# Patient Record
Sex: Female | Born: 1960 | Race: White | Hispanic: No | Marital: Married | Smoking: Current every day smoker
Health system: Southern US, Community
[De-identification: ages and names within clinical notes are randomized; demographics above are authoritative.]

## PROBLEM LIST (undated history)

## (undated) DIAGNOSIS — F329 Major depressive disorder, single episode, unspecified: Secondary | ICD-10-CM

## (undated) DIAGNOSIS — F32A Depression, unspecified: Secondary | ICD-10-CM

## (undated) HISTORY — PX: AMPUTATION FINGER: SHX6594

## (undated) HISTORY — PX: TMJ ARTHROPLASTY: SHX1066

## (undated) HISTORY — DX: Major depressive disorder, single episode, unspecified: F32.9

## (undated) HISTORY — DX: Depression, unspecified: F32.A

---

## 2000-12-20 HISTORY — PX: AUGMENTATION MAMMAPLASTY: SUR837

## 2013-04-03 ENCOUNTER — Ambulatory Visit: Payer: Self-pay | Admitting: Unknown Physician Specialty

## 2013-04-05 LAB — PATHOLOGY REPORT

## 2014-05-10 DIAGNOSIS — Z720 Tobacco use: Secondary | ICD-10-CM | POA: Insufficient documentation

## 2014-05-10 DIAGNOSIS — R413 Other amnesia: Secondary | ICD-10-CM | POA: Insufficient documentation

## 2014-07-18 DIAGNOSIS — G479 Sleep disorder, unspecified: Secondary | ICD-10-CM | POA: Insufficient documentation

## 2016-10-19 DIAGNOSIS — F32A Depression, unspecified: Secondary | ICD-10-CM | POA: Insufficient documentation

## 2016-10-19 DIAGNOSIS — F329 Major depressive disorder, single episode, unspecified: Secondary | ICD-10-CM | POA: Insufficient documentation

## 2017-10-14 ENCOUNTER — Encounter: Payer: Self-pay | Admitting: Obstetrics and Gynecology

## 2017-10-18 ENCOUNTER — Encounter: Payer: Self-pay | Admitting: Obstetrics and Gynecology

## 2017-10-18 ENCOUNTER — Ambulatory Visit (INDEPENDENT_AMBULATORY_CARE_PROVIDER_SITE_OTHER): Payer: 59 | Admitting: Obstetrics and Gynecology

## 2017-10-18 VITALS — BP 122/80 | HR 67 | Ht 63.0 in | Wt 122.0 lb

## 2017-10-18 DIAGNOSIS — N9419 Other specified dyspareunia: Secondary | ICD-10-CM | POA: Diagnosis not present

## 2017-10-18 DIAGNOSIS — Z1239 Encounter for other screening for malignant neoplasm of breast: Secondary | ICD-10-CM

## 2017-10-18 DIAGNOSIS — Z1231 Encounter for screening mammogram for malignant neoplasm of breast: Secondary | ICD-10-CM

## 2017-10-18 DIAGNOSIS — Z124 Encounter for screening for malignant neoplasm of cervix: Secondary | ICD-10-CM

## 2017-10-18 DIAGNOSIS — N952 Postmenopausal atrophic vaginitis: Secondary | ICD-10-CM | POA: Diagnosis not present

## 2017-10-18 DIAGNOSIS — Z01419 Encounter for gynecological examination (general) (routine) without abnormal findings: Secondary | ICD-10-CM

## 2017-10-18 MED ORDER — ESTROGENS, CONJUGATED 0.625 MG/GM VA CREA: 1.0000 | TOPICAL_CREAM | VAGINAL | 3 refills | Status: DC

## 2017-10-18 NOTE — Patient Instructions (Signed)
PCP [facility]: Dr. Dimas Aguas or Dr. Dion [facility]: Dr. Kirkland Hun [facility]  Preventive Care 40-64 Years, Female Preventive care refers to lifestyle choices and visits with your health care provider that can promote health and wellness. What does preventive care include?  A yearly physical exam. This is also called an annual well check.  Dental exams once or twice a year.  Routine eye exams. Ask your health care provider how often you should have your eyes checked.  Personal lifestyle choices, including: ? Daily care of your teeth and gums. ? Regular physical activity. ? Eating a healthy diet. ? Avoiding tobacco and drug use. ? Limiting alcohol use. ? Practicing safe sex. ? Taking low-dose aspirin daily starting at age 3. ? Taking vitamin and mineral supplements as recommended by your health care provider. What happens during an annual well check? The services and screenings done by your health care provider during your annual well check will depend on your age, overall health, lifestyle risk factors, and family history of disease. Counseling Your health care provider may ask you questions about your:  Alcohol use.  Tobacco use.  Drug use.  Emotional well-being.  Home and relationship well-being.  Sexual activity.  Eating habits.  Work and work Statistician.  Method of birth control.  Menstrual cycle.  Pregnancy history.  Screening You may have the following tests or measurements:  Height, weight, and BMI.  Blood pressure.  Lipid and cholesterol levels. These may be checked every 5 years, or more frequently if you are over 34 years old.  Skin check.  Lung cancer screening. You may have this screening every year starting at age 55 if you have a 30-pack-year history of smoking and currently smoke or have quit within the past 15 years.  Fecal occult blood test (FOBT) of the stool. You may have this test every year  starting at age 20.  Flexible sigmoidoscopy or colonoscopy. You may have a sigmoidoscopy every 5 years or a colonoscopy every 10 years starting at age 51.  Hepatitis C blood test.  Hepatitis B blood test.  Sexually transmitted disease (STD) testing.  Diabetes screening. This is done by checking your blood sugar (glucose) after you have not eaten for a while (fasting). You may have this done every 1-3 years.  Mammogram. This may be done every 1-2 years. Talk to your health care provider about when you should start having regular mammograms. This may depend on whether you have a family history of breast cancer.  BRCA-related cancer screening. This may be done if you have a family history of breast, ovarian, tubal, or peritoneal cancers.  Pelvic exam and Pap test. This may be done every 3 years starting at age 41. Starting at age 23, this may be done every 5 years if you have a Pap test in combination with an HPV test.  Bone density scan. This is done to screen for osteoporosis. You may have this scan if you are at high risk for osteoporosis.  Discuss your test results, treatment options, and if necessary, the need for more tests with your health care provider. Vaccines Your health care provider may recommend certain vaccines, such as:  Influenza vaccine. This is recommended every year.  Tetanus, diphtheria, and acellular pertussis (Tdap, Td) vaccine. You may need a Td booster every 10 years.  Varicella vaccine. You may need this if you have not been vaccinated.  Zoster vaccine. You may need this after age 3.  Measles, mumps, and  rubella (MMR) vaccine. You may need at least one dose of MMR if you were born in 1957 or later. You may also need a second dose.  Pneumococcal 13-valent conjugate (PCV13) vaccine. You may need this if you have certain conditions and were not previously vaccinated.  Pneumococcal polysaccharide (PPSV23) vaccine. You may need one or two doses if you smoke  cigarettes or if you have certain conditions.  Meningococcal vaccine. You may need this if you have certain conditions.  Hepatitis A vaccine. You may need this if you have certain conditions or if you travel or work in places where you may be exposed to hepatitis A.  Hepatitis B vaccine. You may need this if you have certain conditions or if you travel or work in places where you may be exposed to hepatitis B.  Haemophilus influenzae type b (Hib) vaccine. You may need this if you have certain conditions.  Talk to your health care provider about which screenings and vaccines you need and how often you need them. This information is not intended to replace advice given to you by your health care provider. Make sure you discuss any questions you have with your health care provider. Document Released: 01/02/2016 Document Revised: 08/25/2016 Document Reviewed: 10/07/2015 Elsevier Interactive Patient Education  2017 [employer].

## 2017-10-18 NOTE — Progress Notes (Signed)
Gynecology Annual Exam  PCP: Patient, No Pcp Per  Chief Complaint:  Chief Complaint  Patient presents with  . Gynecologic Exam    History of Present Illness:Patient is a 56 y.o. G0P0000 presents for annual exam. The patient has no complaints today.   LMP: No LMP recorded. Patient is postmenopausal.  The patient is sexually active. She has dyspareunia.  The patient does perform self breast exams.  There is no notable family history of breast or ovarian cancer in her family.  The patient wears seatbelts: yes.   The patient has regular exercise: not asked.    The patient denies current symptoms of depression.     Review of Systems: Review of Systems  Constitutional: Negative for chills and fever.  HENT: Negative for congestion.   Respiratory: Negative for cough and shortness of breath.   Cardiovascular: Negative for chest pain and palpitations.  Gastrointestinal: Negative for abdominal pain, constipation, diarrhea, heartburn, nausea and vomiting.  Genitourinary: Negative for dysuria, frequency and urgency.  Skin: Negative for itching and rash.  Neurological: Negative for dizziness and headaches.  Endo/Heme/Allergies: Negative for polydipsia.  Psychiatric/Behavioral: Negative for depression.    Past Medical History:  Past Medical History:  Diagnosis Date  . Depression     Past Surgical History:  Past Surgical History:  Procedure Laterality Date  . AMPUTATION FINGER Right    index finger  . TMJ ARTHROPLASTY      Gynecologic History:  No LMP recorded. Patient is postmenopausal. Last Pap: Results were: 07/10/2017 NIL and HR HPV negative   Family History:  Family History  Problem Relation Age of Onset  . Colon cancer Father 91  . Melanoma Sister 65    Social History:  Social History   Social History  . Marital status: Married    Spouse name: N/A  . Number of children: N/A  . Years of education: N/A   Occupational History  . Not on file.   Social  History Main Topics  . Smoking status: Never Smoker  . Smokeless tobacco: Never Used  . Alcohol use No  . Drug use: No  . Sexual activity: Yes    Birth control/ protection: Post-menopausal   Other Topics Concern  . Not on file   Social History Narrative  . No narrative on file    Allergies:  No Known Allergies  Medications: Prior to Admission medications   Medication Sig Start Date End Date Taking? Authorizing Provider  escitalopram (LEXAPRO) 20 MG tablet Take by mouth.   Yes [provider]    Physical Exam Vitals: Blood pressure 122/80, pulse 67, height 5\' 3"  (1.6 m), weight 122 lb (55.3 kg).  General: NAD HEENT: normocephalic, anicteric Thyroid: no enlargement, no palpable nodules Pulmonary: No increased work of breathing, CTAB Cardiovascular: RRR, distal pulses 2+ Breast: Breast symmetrical, no tenderness, no palpable nodules or masses, no skin or nipple retraction present, no nipple discharge.  No axillary or supraclavicular lymphadenopathy. Abdomen: NABS, soft, non-tender, non-distended.  Umbilicus without lesions.  No hepatomegaly, splenomegaly or masses palpable. No evidence of hernia  Genitourinary:  External: Normal external female genitalia.  Normal urethral meatus, normal  Bartholin's and Skene's glands.    Vagina: Normal vaginal mucosa, no evidence of prolapse.    Cervix: Grossly normal in appearance, no bleeding  Uterus: Non-enlarged, mobile, normal contour.  No CMT  Adnexa: ovaries non-enlarged, no adnexal masses  Rectal: deferred  Lymphatic: no evidence of inguinal lymphadenopathy Extremities: no edema, erythema, or tenderness Neurologic:  Grossly intact Psychiatric: mood appropriate, affect full  Female chaperone present for pelvic and breast  portions of the physical exam     Assessment: 56 y.o. G0P0000 routine annual exam  Plan: Problem List Items Addressed This Visit    None    Visit Diagnoses    Dyspareunia due to medical condition  in female    -  Primary   Screening for malignant neoplasm of cervix       Relevant Orders   PapIG, HPV, rfx 16/18   Breast screening       Relevant Orders   MM DIGITAL SCREENING BILATERAL   Encounter for gynecological examination without abnormal finding       Vaginal atrophy          1) Mammogram - recommend yearly screening mammogram.  Mammogram Was ordered today  2) STI screening was not offered  3) ASCCP guidelines and rational discussed.  Patient opts for yearly screening interval  4) Osteoporosis  - per USPTF routine screening DEXA at age 59 - normal baseline DEXA 2013  5) Routine healthcare maintenance including cholesterol, diabetes screening discussed managed by PCP  6) Colonoscopy - 04/03/2013 Tubular adenoma recommended follow up is 2019  7) Follow up 1 year for routine annual

## 2017-10-20 ENCOUNTER — Encounter: Payer: Self-pay | Admitting: Obstetrics and Gynecology

## 2017-10-20 LAB — PAPIG, HPV, RFX 16/18
HPV, HIGH-RISK: NEGATIVE
PAP Smear Comment: 0

## 2018-01-25 ENCOUNTER — Other Ambulatory Visit: Payer: Self-pay | Admitting: Obstetrics and Gynecology

## 2018-01-25 ENCOUNTER — Ambulatory Visit
Admission: RE | Admit: 2018-01-25 | Discharge: 2018-01-25 | Disposition: A | Discharge status: Home / Self Care | Payer: 59 | Source: Ambulatory Visit | Attending: Obstetrics and Gynecology | Admitting: Obstetrics and Gynecology

## 2018-01-25 DIAGNOSIS — Z1239 Encounter for other screening for malignant neoplasm of breast: Secondary | ICD-10-CM

## 2018-01-25 DIAGNOSIS — Z1231 Encounter for screening mammogram for malignant neoplasm of breast: Secondary | ICD-10-CM | POA: Diagnosis present

## 2018-02-01 ENCOUNTER — Other Ambulatory Visit: Payer: Self-pay | Admitting: *Deleted

## 2018-02-01 ENCOUNTER — Inpatient Hospital Stay
Admission: RE | Admit: 2018-02-01 | Discharge: 2018-02-01 | Disposition: A | Discharge status: Home / Self Care | Payer: Self-pay | Source: Ambulatory Visit | Attending: *Deleted | Admitting: *Deleted

## 2018-02-01 DIAGNOSIS — Z9289 Personal history of other medical treatment: Secondary | ICD-10-CM

## 2018-02-02 ENCOUNTER — Encounter: Payer: Self-pay | Admitting: Obstetrics and Gynecology

## 2021-01-16 ENCOUNTER — Other Ambulatory Visit: Payer: Self-pay

## 2021-01-16 ENCOUNTER — Encounter: Payer: Self-pay | Admitting: Obstetrics and Gynecology

## 2021-01-16 ENCOUNTER — Ambulatory Visit (INDEPENDENT_AMBULATORY_CARE_PROVIDER_SITE_OTHER): Payer: No Typology Code available for payment source | Admitting: Obstetrics and Gynecology

## 2021-01-16 ENCOUNTER — Other Ambulatory Visit (HOSPITAL_COMMUNITY)
Admission: RE | Admit: 2021-01-16 | Discharge: 2021-01-16 | Disposition: A | Discharge status: Home / Self Care | Payer: No Typology Code available for payment source | Source: Ambulatory Visit | Attending: Obstetrics and Gynecology | Admitting: Obstetrics and Gynecology

## 2021-01-16 VITALS — BP 120/70 | Ht 63.0 in | Wt 126.3 lb

## 2021-01-16 DIAGNOSIS — Z124 Encounter for screening for malignant neoplasm of cervix: Secondary | ICD-10-CM | POA: Insufficient documentation

## 2021-01-16 DIAGNOSIS — Z1239 Encounter for other screening for malignant neoplasm of breast: Secondary | ICD-10-CM

## 2021-01-16 DIAGNOSIS — Z01419 Encounter for gynecological examination (general) (routine) without abnormal findings: Secondary | ICD-10-CM | POA: Diagnosis not present

## 2021-01-16 DIAGNOSIS — Z1211 Encounter for screening for malignant neoplasm of colon: Secondary | ICD-10-CM

## 2021-01-16 MED ORDER — PREMARIN 0.625 MG/GM VA CREA: 1.0000 | TOPICAL_CREAM | VAGINAL | 3 refills | Status: DC

## 2021-01-16 NOTE — Patient Instructions (Signed)
[facility] [address] [place] [address]  [facility]  [address] [place] [address]  Phone: [phone]  

## 2021-01-16 NOTE — Progress Notes (Signed)
Annual Exam

## 2021-01-16 NOTE — Progress Notes (Signed)
Gynecology Annual Exam  PCP: Patient, No Pcp Per  Chief Complaint: No chief complaint on file.   History of Present Illness:Patient is a 60 y.o. G0P0000 presents for annual exam. The patient has no complaints today.   LMP: No LMP recorded. Patient is postmenopausal. No PMB concerns.  The patient is sexually active. She denies dyspareunia.  The patient does perform self breast exams.  There is no notable family history of breast or ovarian cancer in her family.  The patient wears seatbelts: yes.   The patient has regular exercise: not asked.    The patient denies current symptoms of depression, stable on current dose cymbalta.     Review of Systems: Review of Systems  Constitutional: Negative for chills and fever.  HENT: Negative for congestion.   Respiratory: Negative for cough and shortness of breath.   Cardiovascular: Negative for chest pain and palpitations.  Gastrointestinal: Negative for abdominal pain, constipation, diarrhea, heartburn, nausea and vomiting.  Genitourinary: Negative for dysuria, frequency and urgency.  Skin: Negative for itching and rash.  Neurological: Negative for dizziness and headaches.  Endo/Heme/Allergies: Negative for polydipsia.  Psychiatric/Behavioral: Negative for depression.    Past Medical History:  There are no problems to display for this patient.   Past Surgical History:  Past Surgical History:  Procedure Laterality Date  . AMPUTATION FINGER Right    index finger  . AUGMENTATION MAMMAPLASTY Bilateral 4315   silicone  . TMJ ARTHROPLASTY      Gynecologic History:  No LMP recorded. Patient is postmenopausal. Last Pap: Results were: 10/01/2017 NIL and HR HPV negative  Last mammogram: 01/25/2018 Results were: BI-RAD I  Obstetric History: G0P0000  Family History:  Family History  Problem Relation Age of Onset  . Colon cancer Father 54  . Melanoma Sister 21    Social History:  Social History   Socioeconomic History  .  Marital status: Married    Spouse name: Not on file  . Number of children: Not on file  . Years of education: Not on file  . Highest education level: Not on file  Occupational History  . Not on file  Tobacco Use  . Smoking status: Never Smoker  . Smokeless tobacco: Never Used  Vaping Use  . Vaping Use: Every day  Substance and Sexual Activity  . Alcohol use: No  . Drug use: No  . Sexual activity: Yes    Birth control/protection: Post-menopausal  Other Topics Concern  . Not on file  Social History Narrative  . Not on file   Social Determinants of Health   Financial Resource Strain: Not on file  Food Insecurity: Not on file  Transportation Needs: Not on file  Physical Activity: Not on file  Stress: Not on file  Social Connections: Not on file  Intimate Partner Violence: Not on file    Allergies:  No Known Allergies  Medications: Prior to Admission medications   Medication Sig Start Date End Date Taking? Authorizing Provider  DULoxetine (CYMBALTA) 30 MG capsule Take 30 mg by mouth daily. 11/26/20  Yes [provider]  conjugated estrogens (PREMARIN) vaginal cream Place 1 Applicatorful vaginally 2 (two) times a week. 10/20/17   Malachy Mood, MD  escitalopram (LEXAPRO) 20 MG tablet Take by mouth. Patient not taking: Reported on 01/16/2021    [provider]    Physical Exam Vitals: Blood pressure 120/70, height 5\' 3"  (1.6 m), weight 126 lb 4.8 oz (57.3 kg).  General: NAD HEENT: normocephalic, anicteric Thyroid:  no enlargement, no palpable nodules Pulmonary: No increased work of breathing, CTAB Cardiovascular: RRR, distal pulses 2+ Breast: Breast symmetrical, no tenderness, no palpable nodules or masses, no skin or nipple retraction present, no nipple discharge.  No axillary or supraclavicular lymphadenopathy. Bilateral above muscle implants Abdomen: NABS, soft, non-tender, non-distended.  Umbilicus without lesions.  No hepatomegaly, splenomegaly or  masses palpable. No evidence of hernia  Genitourinary:  External: Normal external female genitalia.  Normal urethral meatus, normal Bartholin's and Skene's glands.    Vagina: Normal vaginal mucosa, no evidence of prolapse.    Cervix: Grossly normal in appearance, no bleeding  Uterus: Non-enlarged, mobile, normal contour.  No CMT  Adnexa: ovaries non-enlarged, no adnexal masses  Rectal: deferred  Lymphatic: no evidence of inguinal lymphadenopathy Extremities: no edema, erythema, or tenderness Neurologic: Grossly intact Psychiatric: mood appropriate, affect full  Female chaperone present for pelvic and breast  portions of the physical exam   There is no immunization history on file for this patient.    Assessment: 60 y.o. G0P0000 routine annual exam  Plan: Problem List Items Addressed This Visit   None   Visit Diagnoses    Encounter for gynecological examination without abnormal finding    -  Primary   Breast screening       Relevant Orders   MS DIGITAL SCREENING TOMO W/IMPLANTS BILAT   Screening for malignant neoplasm of cervix       Relevant Orders   Cytology - PAP   Colon cancer screening       Relevant Orders   Ambulatory referral to Gastroenterology      1) Mammogram - recommend yearly screening mammogram.  Mammogram Was ordered today  2) STI screening  was notoffered and therefore not obtained  3) ASCCP guidelines and rational discussed.  Patient opts for every 3 years screening interval  4) Osteoporosis  - per USPTF routine screening DEXA at age 38  5) Routine healthcare maintenance including cholesterol, diabetes screening discussed managed by PCP  6) Colonoscopy 04/03/2013 Tubular adenoma recommended follow up is 2019.  Has not had done yet so ordered  7) Return in about 1 year (around 01/16/2022) for annual.    Malachy Mood, MD Alyssa Sosa, [place] 01/16/2021, 3:04 PM

## 2021-01-21 LAB — CYTOLOGY - PAP
Comment: NEGATIVE
Diagnosis: NEGATIVE
High risk HPV: NEGATIVE

## 2021-02-12 ENCOUNTER — Encounter: Payer: Self-pay | Admitting: *Deleted

## 2021-02-19 ENCOUNTER — Other Ambulatory Visit: Payer: Self-pay | Admitting: Obstetrics and Gynecology

## 2021-02-19 DIAGNOSIS — Z1239 Encounter for other screening for malignant neoplasm of breast: Secondary | ICD-10-CM

## 2021-02-24 ENCOUNTER — Telehealth: Payer: Self-pay

## 2021-02-24 ENCOUNTER — Telehealth (INDEPENDENT_AMBULATORY_CARE_PROVIDER_SITE_OTHER): Payer: Self-pay | Admitting: Gastroenterology

## 2021-02-24 ENCOUNTER — Other Ambulatory Visit: Payer: Self-pay

## 2021-02-24 DIAGNOSIS — Z86010 Personal history of colonic polyps: Secondary | ICD-10-CM

## 2021-02-24 DIAGNOSIS — K635 Polyp of colon: Secondary | ICD-10-CM | POA: Insufficient documentation

## 2021-02-24 MED ORDER — PEG 3350-KCL-NA BICARB-NACL 420 G PO SOLR: 4000.0000 mL | Freq: Once | ORAL | 0 refills | Status: AC

## 2021-02-24 NOTE — Telephone Encounter (Signed)
Patients call has been returned in regard to moving her colonoscopy date from 03/15 to 03/16.  LVM to let her know that her colonoscopy date has been changed to 03/04/21.  Physician is now Dr. Marius Ditch instead of Dr. Bonna Gains. Pt advised of COVID test date Monday 03/02/21.  New instructions will be sent via mychart and mailed.  Thanks,  Alyssa Sosa, [place]

## 2021-02-24 NOTE — Progress Notes (Signed)
Gastroenterology Pre-Procedure Review  Request Date: Tuesday 03/03/21 Requesting Physician: Dr. Allen Norris  PATIENT REVIEW QUESTIONS: The patient responded to the following health history questions as indicated:    1. Are you having any GI issues? no 2. Do you have a personal history of Polyps? yes (pt states 6-7 years ago she had polyps on last colonoscopy ) 3. Do you have a family history of Colon Cancer or Polyps? yes (father colon cancer) 4. Diabetes Mellitus? no 5. Joint replacements in the past 12 months?no 6. Major health problems in the past 3 months?no 7. Any artificial heart valves, MVP, or defibrillator?no    MEDICATIONS & ALLERGIES:    Patient reports the following regarding taking any anticoagulation/antiplatelet therapy:   Plavix, Coumadin, Eliquis, Xarelto, Lovenox, Pradaxa, Brilinta, or Effient? no Aspirin? no  Patient confirms/reports the following medications:  Current Outpatient Medications  Medication Sig Dispense Refill  . DULoxetine (CYMBALTA) 30 MG capsule Take 30 mg by mouth daily.    . polyethylene glycol-electrolytes (NULYTELY) 420 g solution Take 4,000 mLs by mouth once for 1 dose. Fill Nulytely container to the fill line with clear liquid.  Mix well.  Drink 8 oz every 30 minutes until entire contents have been completed. 4000 mL 0  . conjugated estrogens (PREMARIN) vaginal cream Place 1 Applicatorful vaginally 2 (two) times a week. (Patient not taking: Reported on 02/24/2021) 30 g 3  . ergocalciferol (VITAMIN D2) 1.25 MG (50000 UT) capsule ergocalciferol (vitamin D2) 1,250 mcg (50,000 unit) capsule     No current facility-administered medications for this visit.    Patient confirms/reports the following allergies:  No Known Allergies  Orders Placed This Encounter  Procedures  . Procedural/ Surgical Case Request: COLONOSCOPY WITH PROPOFOL    Standing Status:   Standing    Number of Occurrences:   1    Order Specific Question:   Pre-op diagnosis    Answer:    personal history of colon polyps    Order Specific Question:   CPT Code    Answer:   82641    AUTHORIZATION INFORMATION Primary Insurance: 1D#: Group #:  Secondary Insurance: 1D#: Group #:  SCHEDULE INFORMATION: Date: 03/03/21 Time: Location:[facility]

## 2021-03-02 ENCOUNTER — Other Ambulatory Visit: Payer: Self-pay

## 2021-03-02 ENCOUNTER — Other Ambulatory Visit
Admission: RE | Admit: 2021-03-02 | Discharge: 2021-03-02 | Disposition: A | Discharge status: Home / Self Care | Payer: No Typology Code available for payment source | Source: Ambulatory Visit | Attending: Gastroenterology | Admitting: Gastroenterology

## 2021-03-02 DIAGNOSIS — Z20822 Contact with and (suspected) exposure to covid-19: Secondary | ICD-10-CM | POA: Insufficient documentation

## 2021-03-02 DIAGNOSIS — Z01812 Encounter for preprocedural laboratory examination: Secondary | ICD-10-CM | POA: Insufficient documentation

## 2021-03-02 LAB — SARS CORONAVIRUS 2 (TAT 6-24 HRS): SARS Coronavirus 2: NEGATIVE

## 2021-03-04 ENCOUNTER — Ambulatory Visit: Payer: No Typology Code available for payment source | Admitting: Anesthesiology

## 2021-03-04 ENCOUNTER — Encounter
Admission: RE | Disposition: A | Discharge status: Home / Self Care | Payer: Self-pay | Source: Home / Self Care | Attending: Gastroenterology

## 2021-03-04 ENCOUNTER — Ambulatory Visit
Admission: RE | Admit: 2021-03-04 | Discharge: 2021-03-04 | Disposition: A | Discharge status: Home / Self Care | Payer: No Typology Code available for payment source | Attending: Gastroenterology | Admitting: Gastroenterology

## 2021-03-04 ENCOUNTER — Other Ambulatory Visit: Payer: Self-pay

## 2021-03-04 ENCOUNTER — Encounter: Payer: Self-pay | Admitting: Gastroenterology

## 2021-03-04 DIAGNOSIS — Z86010 Personal history of colonic polyps: Secondary | ICD-10-CM

## 2021-03-04 DIAGNOSIS — Z800 Family history of malignant neoplasm of digestive organs: Secondary | ICD-10-CM | POA: Diagnosis not present

## 2021-03-04 DIAGNOSIS — K644 Residual hemorrhoidal skin tags: Secondary | ICD-10-CM | POA: Diagnosis not present

## 2021-03-04 DIAGNOSIS — D122 Benign neoplasm of ascending colon: Secondary | ICD-10-CM | POA: Insufficient documentation

## 2021-03-04 DIAGNOSIS — Z89021 Acquired absence of right finger(s): Secondary | ICD-10-CM | POA: Diagnosis not present

## 2021-03-04 DIAGNOSIS — K635 Polyp of colon: Secondary | ICD-10-CM

## 2021-03-04 DIAGNOSIS — Z79899 Other long term (current) drug therapy: Secondary | ICD-10-CM | POA: Insufficient documentation

## 2021-03-04 DIAGNOSIS — Z1211 Encounter for screening for malignant neoplasm of colon: Secondary | ICD-10-CM | POA: Insufficient documentation

## 2021-03-04 HISTORY — PX: COLONOSCOPY WITH PROPOFOL: SHX5780

## 2021-03-04 SURGERY — COLONOSCOPY WITH PROPOFOL
Anesthesia: General

## 2021-03-04 MED ORDER — PROPOFOL 500 MG/50ML IV EMUL
INTRAVENOUS | Status: DC | PRN
  Administered 2021-03-04: 176 ug/kg/min via INTRAVENOUS

## 2021-03-04 MED ORDER — PROPOFOL 500 MG/50ML IV EMUL
INTRAVENOUS | Status: AC
  Filled 2021-03-04: qty 50

## 2021-03-04 MED ORDER — LIDOCAINE HCL (PF) 2 % IJ SOLN
INTRAMUSCULAR | Status: AC
  Filled 2021-03-04: qty 5

## 2021-03-04 MED ORDER — SODIUM CHLORIDE 0.9 % IV SOLN: INTRAVENOUS | Status: DC

## 2021-03-04 MED ORDER — PROPOFOL 10 MG/ML IV BOLUS
INTRAVENOUS | Status: DC | PRN
  Administered 2021-03-04: 80 mg via INTRAVENOUS

## 2021-03-04 MED ORDER — LIDOCAINE HCL (CARDIAC) PF 100 MG/5ML IV SOSY
PREFILLED_SYRINGE | INTRAVENOUS | Status: DC | PRN
  Administered 2021-03-04: 50 mg via INTRAVENOUS

## 2021-03-04 NOTE — Transfer of Care (Signed)
Immediate Anesthesia Transfer of Care Note  Patient: Alyssa Sosa  Procedure(s) Performed: COLONOSCOPY WITH PROPOFOL (N/A )  Patient Location: PACU  Anesthesia Type:General  Level of Consciousness: awake and drowsy  Airway & Oxygen Therapy: Patient Spontanous Breathing  Post-op Assessment: Report given to RN and Post -op Vital signs reviewed and stable  Post vital signs: Reviewed and stable  Last Vitals:  Vitals Value Taken Time  BP 97/70 03/04/21 1052  Temp 36.5 C 03/04/21 1050  Pulse 71 03/04/21 1052  Resp 16 03/04/21 1052  SpO2 99 % 03/04/21 1052    Last Pain:  Vitals:   03/04/21 1050  TempSrc: Temporal  PainSc: 0-No pain         Complications: No complications documented.

## 2021-03-04 NOTE — Op Note (Signed)
[facility] Gastroenterology Patient Name: Natha Guin Procedure Date: 03/04/2021 10:12 AM MRN: [id number] Account #: [id number] Date of Birth: 07/21/1961 Admit Type: Outpatient Age: 60 Room: [facility] ENDO ROOM 4 Gender: Female Note Status: Finalized Procedure:             Colonoscopy Indications:           Screening in patient at increased risk: Colorectal                         cancer in father before age 22, Surveillance: Personal                         history of adenomatous polyps on last colonoscopy 5                         years ago, Last colonoscopy: April 2014 Providers:             Lin Landsman MD, MD Medicines:             General Anesthesia Complications:         No immediate complications. Estimated blood loss: None. Procedure:             Pre-Anesthesia Assessment:                        - Prior to the procedure, a History and Physical was                         performed, and patient medications and allergies were                         reviewed. The patient is competent. The risks and                         benefits of the procedure and the sedation options and                         risks were discussed with the patient. All questions                         were answered and informed consent was obtained.                         Patient identification and proposed procedure were                         verified by the physician, the nurse, the                         anesthesiologist, the anesthetist and the technician                         in the pre-procedure area in the procedure room in the                         endoscopy suite. Mental Status Examination: alert and  oriented. Airway Examination: normal oropharyngeal                         airway and neck mobility. Respiratory Examination:                         clear to auscultation. CV Examination: normal.                         Prophylactic  Antibiotics: The patient does not require                         prophylactic antibiotics. Prior Anticoagulants: The                         patient has taken no previous anticoagulant or                         antiplatelet agents. ASA Grade Assessment: II - A                         patient with mild systemic disease. After reviewing                         the risks and benefits, the patient was deemed in                         satisfactory condition to undergo the procedure. The                         anesthesia plan was to use general anesthesia.                         Immediately prior to administration of medications,                         the patient was re-assessed for adequacy to receive                         sedatives. The heart rate, respiratory rate, oxygen                         saturations, blood pressure, adequacy of pulmonary                         ventilation, and response to care were monitored                         throughout the procedure. The physical status of the                         patient was re-assessed after the procedure.                        After obtaining informed consent, the colonoscope was                         passed under direct vision. Throughout the procedure,  the patient's blood pressure, pulse, and oxygen                         saturations were monitored continuously. The was                         introduced through the anus and advanced to the the                         cecum, identified by appendiceal orifice and ileocecal                         valve. The colonoscopy was performed without                         difficulty. The patient tolerated the procedure well.                         The quality of the bowel preparation was evaluated                         using the BBPS [facility] Bowel Preparation Scale) with                         scores of: Right Colon = 3, Transverse Colon = 3 and                          Left Colon = 3 (entire mucosa seen well with no                         residual staining, small fragments of stool or opaque                         liquid). The total BBPS score equals 9. Findings:      Skin tags were found on perianal exam.      Two sessile polyps were found in the ascending colon. The polyps were 4       to 5 mm in size. These polyps were removed with a cold snare. Resection       and retrieval were complete.      The retroflexed view of the distal rectum and anal verge was normal and       showed no anal or rectal abnormalities.      The exam was otherwise without abnormality. Impression:            - Perianal skin tags found on perianal exam.                        - Two 4 to 5 mm polyps in the ascending colon, removed                         with a cold snare. Resected and retrieved.                        - The distal rectum and anal verge are normal on  retroflexion view.                        - The examination was otherwise normal. Recommendation:        - Discharge patient to home (with escort).                        - Resume previous diet today.                        - Continue present medications.                        - Await pathology results.                        - Repeat colonoscopy in 5 years for surveillance. Procedure Code(s):     --- Professional ---                        [id number], Colonoscopy, flexible; with removal of                         tumor(s), polyp(s), or other lesion(s) by snare                         technique Diagnosis Code(s):     --- Professional ---                        Z80.0, Family history of malignant neoplasm of                         digestive organs                        Z86.010, Personal history of colonic polyps                        K63.5, Polyp of colon                        K64.4, Residual hemorrhoidal skin tags CPT copyright 2019 American Medical Association. All rights  reserved. The codes documented in this report are preliminary and upon coder review may  be revised to meet current compliance requirements. Dr. Ulyess Mort Lin Landsman MD, MD 03/04/2021 10:48:13 AM This report has been signed electronically. Number of Addenda: 0 Note Initiated On: 03/04/2021 10:12 AM Scope Withdrawal Time: 0 hours 16 minutes 7 seconds  Total Procedure Duration: 0 hours 22 minutes 3 seconds  Estimated Blood Loss:  Estimated blood loss: none.      [facility]

## 2021-03-04 NOTE — Anesthesia Preprocedure Evaluation (Signed)
Anesthesia Evaluation  Patient identified by MRN, date of birth, ID band Patient awake    Reviewed: Allergy & Precautions, H&P , NPO status , Patient's Chart, lab work & pertinent test results, reviewed documented beta blocker date and time   Airway Mallampati: II   Neck ROM: full    Dental  (+) Poor Dentition   Pulmonary neg pulmonary ROS, Patient abstained from smoking.,    Pulmonary exam normal        Cardiovascular Exercise Tolerance: Good negative cardio ROS Normal cardiovascular exam Rhythm:regular Rate:Normal     Neuro/Psych PSYCHIATRIC DISORDERS Depression negative neurological ROS     GI/Hepatic negative GI ROS, Neg liver ROS,   Endo/Other  negative endocrine ROS  Renal/GU negative Renal ROS  negative genitourinary   Musculoskeletal   Abdominal   Peds  Hematology negative hematology ROS (+)   Anesthesia Other Findings Past Medical History: No date: Depression Past Surgical History: No date: AMPUTATION FINGER; Right     Comment:  index finger 2002: AUGMENTATION MAMMAPLASTY; Bilateral     Comment:  silicone 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 BMI    Body Mass Index: 19.84 kg/m     Reproductive/Obstetrics negative OB ROS                             Anesthesia Physical Anesthesia Plan  ASA: II  Anesthesia Plan: General   Post-op Pain Management:    Induction:   PONV Risk Score and Plan:   Airway Management Planned:   Additional Equipment:   Intra-op Plan:   Post-operative Plan:   Informed Consent: I have reviewed the patients History and Physical, chart, labs and discussed the procedure including the risks, benefits and alternatives for the proposed anesthesia with the patient or authorized representative who has indicated his/her understanding and acceptance.     Dental Advisory Given  Plan Discussed with: CRNA  Anesthesia Plan Comments:         Anesthesia  Quick Evaluation

## 2021-03-04 NOTE — Anesthesia Procedure Notes (Signed)
Date/Time: 03/04/2021 10:20 AM Performed by: Johnna Acosta, CRNA Pre-anesthesia Checklist: Patient identified, Emergency Drugs available, Suction available, Patient being monitored and Timeout performed Patient Re-evaluated:Patient Re-evaluated prior to induction Oxygen Delivery Method: Nasal cannula Preoxygenation: Pre-oxygenation with 100% oxygen Induction Type: IV induction

## 2021-03-04 NOTE — H&P (Signed)
  Cephas Darby, MD [address]  [place]  [place], [place] [address]  Main: [phone]  Fax: [phone] Pager: [phone]  Primary Care Physician:  Newell Coral, PA-C Primary Gastroenterologist:  Dr. Cephas Darby  Pre-Procedure History & Physical: HPI:  Alyssa Sosa is a 60 y.o. female is here for an colonoscopy.   Past Medical History:  Diagnosis Date  . Depression     Past Surgical History:  Procedure Laterality Date  . AMPUTATION FINGER Right    index finger  . AUGMENTATION MAMMAPLASTY Bilateral [id number]   silicone  . TMJ ARTHROPLASTY      Prior to Admission medications   Medication Sig Start Date End Date Taking? Authorizing Provider  conjugated estrogens (PREMARIN) vaginal cream Place 1 Applicatorful vaginally 2 (two) times a week. Patient not taking: Reported on 02/24/2021 01/19/21   Malachy Mood, MD  DULoxetine (CYMBALTA) 30 MG capsule Take 30 mg by mouth daily. 11/26/20   [provider]  ergocalciferol (VITAMIN D2) 1.25 MG (50000 UT) capsule ergocalciferol (vitamin D2) 1,250 mcg (50,000 unit) capsule    [provider]    Allergies as of 02/24/2021  . (No Known Allergies)    Family History  Problem Relation Age of Onset  . Colon cancer Father 94  . Melanoma Sister 40    Social History   Socioeconomic History  . Marital status: Married    Spouse name: Not on file  . Number of children: Not on file  . Years of education: Not on file  . Highest education level: Not on file  Occupational History  . Not on file  Tobacco Use  . Smoking status: Never Smoker  . Smokeless tobacco: Never Used  Vaping Use  . Vaping Use: Some days  Substance and Sexual Activity  . Alcohol use: No  . Drug use: No  . Sexual activity: Yes    Birth control/protection: Post-menopausal  Other Topics Concern  . Not on file  Social History Narrative  . Not on file   Social Determinants of Health   Financial Resource Strain: Not on file   Food Insecurity: Not on file  Transportation Needs: Not on file  Physical Activity: Not on file  Stress: Not on file  Social Connections: Not on file  Intimate Partner Violence: Not on file    Review of Systems: See HPI, otherwise negative ROS  Physical Exam: BP 133/85   Pulse 66   Temp (!) 97.2 F (36.2 C) (Temporal)   Resp 16   Ht 5\' 3"  (1.6 m)   Wt 50.8 kg   SpO2 100%   BMI 19.84 kg/m  General:   Alert,  pleasant and cooperative in NAD Head:  Normocephalic and atraumatic. Neck:  Supple; no masses or thyromegaly. Lungs:  Clear throughout to auscultation.    Heart:  Regular rate and rhythm. Abdomen:  Soft, nontender and nondistended. Normal bowel sounds, without guarding, and without rebound.   Neurologic:  Alert and  oriented x4;  grossly normal neurologically.  Impression/Plan: Zalaya A Hakim is here for an colonoscopy to be performed for personal history of colon polyps  Risks, benefits, limitations, and alternatives regarding  colonoscopy have been reviewed with the patient.  Questions have been answered.  All parties agreeable.   Sherri Sear, MD  03/04/2021, 9:46 AM

## 2021-03-04 NOTE — Anesthesia Postprocedure Evaluation (Signed)
Anesthesia Post Note  Patient: Alyssa Sosa  Procedure(s) Performed: COLONOSCOPY WITH PROPOFOL (N/A )  Patient location during evaluation: PACU Anesthesia Type: General Level of consciousness: awake and alert Pain management: pain level controlled Vital Signs Assessment: post-procedure vital signs reviewed and stable Respiratory status: spontaneous breathing, nonlabored ventilation, respiratory function stable and patient connected to nasal cannula oxygen Cardiovascular status: blood pressure returned to baseline and stable Postop Assessment: no apparent nausea or vomiting Anesthetic complications: no   No complications documented.   Last Vitals:  Vitals:   03/04/21 1110 03/04/21 1120  BP: 137/90 139/83  Pulse: (!) 59 (!) 53  Resp: 18 20  Temp:    SpO2: 99% 99%    Last Pain:  Vitals:   03/04/21 1120  TempSrc:   PainSc: 0-No pain                 Molli Barrows

## 2021-03-05 ENCOUNTER — Encounter: Payer: Self-pay | Admitting: Gastroenterology

## 2021-03-05 LAB — SURGICAL PATHOLOGY

## 2021-03-10 ENCOUNTER — Ambulatory Visit
Admission: RE | Admit: 2021-03-10 | Discharge: 2021-03-10 | Disposition: A | Discharge status: Home / Self Care | Payer: No Typology Code available for payment source | Source: Ambulatory Visit | Attending: Obstetrics and Gynecology | Admitting: Obstetrics and Gynecology

## 2021-03-10 ENCOUNTER — Other Ambulatory Visit: Payer: Self-pay

## 2021-03-10 DIAGNOSIS — Z1231 Encounter for screening mammogram for malignant neoplasm of breast: Secondary | ICD-10-CM | POA: Insufficient documentation

## 2021-03-10 DIAGNOSIS — Z1239 Encounter for other screening for malignant neoplasm of breast: Secondary | ICD-10-CM

## 2022-01-18 ENCOUNTER — Inpatient Hospital Stay: Payer: BC Managed Care – PPO

## 2022-01-18 ENCOUNTER — Inpatient Hospital Stay
Admission: EM | Admit: 2022-01-18 | Discharge: 2022-01-21 | DRG: 390 | Disposition: A | Discharge status: Home / Self Care | Payer: BC Managed Care – PPO | Attending: Internal Medicine | Admitting: Internal Medicine

## 2022-01-18 ENCOUNTER — Other Ambulatory Visit: Payer: Self-pay

## 2022-01-18 ENCOUNTER — Emergency Department: Payer: BC Managed Care – PPO

## 2022-01-18 ENCOUNTER — Encounter: Payer: Self-pay | Admitting: Emergency Medicine

## 2022-01-18 DIAGNOSIS — F32A Depression, unspecified: Secondary | ICD-10-CM

## 2022-01-18 DIAGNOSIS — Z888 Allergy status to other drugs, medicaments and biological substances status: Secondary | ICD-10-CM | POA: Diagnosis not present

## 2022-01-18 DIAGNOSIS — D1803 Hemangioma of intra-abdominal structures: Secondary | ICD-10-CM | POA: Diagnosis present

## 2022-01-18 DIAGNOSIS — Z20822 Contact with and (suspected) exposure to covid-19: Secondary | ICD-10-CM | POA: Diagnosis present

## 2022-01-18 DIAGNOSIS — K56600 Partial intestinal obstruction, unspecified as to cause: Principal | ICD-10-CM | POA: Diagnosis present

## 2022-01-18 DIAGNOSIS — E860 Dehydration: Secondary | ICD-10-CM | POA: Diagnosis present

## 2022-01-18 DIAGNOSIS — F17210 Nicotine dependence, cigarettes, uncomplicated: Secondary | ICD-10-CM | POA: Diagnosis present

## 2022-01-18 DIAGNOSIS — Z79899 Other long term (current) drug therapy: Secondary | ICD-10-CM | POA: Diagnosis not present

## 2022-01-18 DIAGNOSIS — Z720 Tobacco use: Secondary | ICD-10-CM | POA: Diagnosis present

## 2022-01-18 DIAGNOSIS — K56609 Unspecified intestinal obstruction, unspecified as to partial versus complete obstruction: Secondary | ICD-10-CM

## 2022-01-18 DIAGNOSIS — Z79818 Long term (current) use of other agents affecting estrogen receptors and estrogen levels: Secondary | ICD-10-CM

## 2022-01-18 LAB — RESP PANEL BY RT-PCR (FLU A&B, COVID) ARPGX2
Influenza A by PCR: NEGATIVE
Influenza B by PCR: NEGATIVE
SARS Coronavirus 2 by RT PCR: NEGATIVE

## 2022-01-18 LAB — CBC
HCT: 46.1 % — ABNORMAL HIGH (ref 36.0–46.0)
Hemoglobin: 15 g/dL (ref 12.0–15.0)
MCH: 31.1 pg (ref 26.0–34.0)
MCHC: 32.5 g/dL (ref 30.0–36.0)
MCV: 95.6 fL (ref 80.0–100.0)
Platelets: 331 10*3/uL (ref 150–400)
RBC: 4.82 MIL/uL (ref 3.87–5.11)
RDW: 13 % (ref 11.5–15.5)
WBC: 10 10*3/uL (ref 4.0–10.5)
nRBC: 0 % (ref 0.0–0.2)

## 2022-01-18 LAB — LIPASE, BLOOD: Lipase: 27 U/L (ref 11–51)

## 2022-01-18 LAB — URINALYSIS, ROUTINE W REFLEX MICROSCOPIC
Bacteria, UA: NONE SEEN
Bilirubin Urine: NEGATIVE
Glucose, UA: NEGATIVE mg/dL
Nitrite: NEGATIVE
Protein, ur: NEGATIVE mg/dL
Specific Gravity, Urine: 1.02 (ref 1.005–1.030)
pH: 8.5 — ABNORMAL HIGH (ref 5.0–8.0)

## 2022-01-18 LAB — COMPREHENSIVE METABOLIC PANEL
ALT: 15 U/L (ref 0–44)
AST: 19 U/L (ref 15–41)
Albumin: 4.1 g/dL (ref 3.5–5.0)
Alkaline Phosphatase: 79 U/L (ref 38–126)
Anion gap: 10 (ref 5–15)
BUN: 23 mg/dL — ABNORMAL HIGH (ref 6–20)
CO2: 26 mmol/L (ref 22–32)
Calcium: 10.4 mg/dL — ABNORMAL HIGH (ref 8.9–10.3)
Chloride: 103 mmol/L (ref 98–111)
Creatinine, Ser: 0.67 mg/dL (ref 0.44–1.00)
GFR, Estimated: >60 mL/min (ref >60)
Glucose, Bld: 138 mg/dL — ABNORMAL HIGH (ref 70–99)
Potassium: 4.2 mmol/L (ref 3.5–5.1)
Sodium: 139 mmol/L (ref 135–145)
Total Bilirubin: 0.4 mg/dL (ref 0.3–1.2)
Total Protein: 7.2 g/dL (ref 6.5–8.1)

## 2022-01-18 LAB — TROPONIN I (HIGH SENSITIVITY)
Troponin I (High Sensitivity): <2 ng/L (ref <18)
Troponin I (High Sensitivity): 2 ng/L (ref <18)

## 2022-01-18 LAB — PROTIME-INR
INR: 0.9 (ref 0.8–1.2)
Prothrombin Time: 12.3 seconds (ref 11.4–15.2)

## 2022-01-18 LAB — POC URINE PREG, ED: Preg Test, Ur: NEGATIVE

## 2022-01-18 LAB — APTT: aPTT: 26 seconds (ref 24–36)

## 2022-01-18 LAB — HIV ANTIBODY (ROUTINE TESTING W REFLEX): HIV Screen 4th Generation wRfx: NONREACTIVE

## 2022-01-18 MED ORDER — PANTOPRAZOLE SODIUM 40 MG IV SOLR
40.0000 mg | Freq: Once | INTRAVENOUS | Status: AC
  Administered 2022-01-18: 40 mg via INTRAVENOUS
  Filled 2022-01-18: qty 40

## 2022-01-18 MED ORDER — FENTANYL CITRATE PF 50 MCG/ML IJ SOSY
50.0000 ug | PREFILLED_SYRINGE | INTRAMUSCULAR | Status: AC | PRN
  Administered 2022-01-18 (×2): 50 ug via INTRAVENOUS
  Filled 2022-01-18 (×2): qty 1

## 2022-01-18 MED ORDER — METOCLOPRAMIDE HCL 5 MG/ML IJ SOLN: 10.0000 mg | Freq: Three times a day (TID) | INTRAMUSCULAR | Status: DC | PRN

## 2022-01-18 MED ORDER — MORPHINE SULFATE (PF) 2 MG/ML IV SOLN
2.0000 mg | INTRAVENOUS | Status: DC | PRN
  Administered 2022-01-18: 2 mg via INTRAVENOUS
  Filled 2022-01-18: qty 1

## 2022-01-18 MED ORDER — ONDANSETRON 4 MG PO TBDP: 4.0000 mg | ORAL_TABLET | Freq: Once | ORAL | Status: DC | PRN

## 2022-01-18 MED ORDER — DULOXETINE HCL 30 MG PO CPEP
30.0000 mg | ORAL_CAPSULE | Freq: Every day | ORAL | Status: DC
  Administered 2022-01-19 – 2022-01-21 (×3): 30 mg via ORAL
  Filled 2022-01-18 (×4): qty 1

## 2022-01-18 MED ORDER — DIPHENHYDRAMINE HCL 50 MG/ML IJ SOLN
25.0000 mg | Freq: Three times a day (TID) | INTRAMUSCULAR | Status: DC | PRN
  Administered 2022-01-18: 25 mg via INTRAVENOUS
  Filled 2022-01-18: qty 1

## 2022-01-18 MED ORDER — ONDANSETRON HCL 4 MG/2ML IJ SOLN
4.0000 mg | Freq: Once | INTRAMUSCULAR | Status: AC
  Administered 2022-01-18: 4 mg via INTRAVENOUS
  Filled 2022-01-18: qty 2

## 2022-01-18 MED ORDER — NICOTINE 21 MG/24HR TD PT24
21.0000 mg | MEDICATED_PATCH | Freq: Every day | TRANSDERMAL | Status: DC
  Administered 2022-01-19 – 2022-01-20 (×2): 21 mg via TRANSDERMAL
  Filled 2022-01-18 (×3): qty 1

## 2022-01-18 MED ORDER — HYDROMORPHONE HCL 1 MG/ML IJ SOLN
0.5000 mg | Freq: Once | INTRAMUSCULAR | Status: AC
Start: 2022-01-18 — End: 2022-01-18
  Administered 2022-01-18: 0.5 mg via INTRAVENOUS
  Filled 2022-01-18: qty 1

## 2022-01-18 MED ORDER — SODIUM CHLORIDE 0.9 % IV BOLUS
1000.0000 mL | Freq: Once | INTRAVENOUS | Status: AC
  Administered 2022-01-18: 1000 mL via INTRAVENOUS

## 2022-01-18 MED ORDER — LACTATED RINGERS IV SOLN: INTRAVENOUS | Status: DC

## 2022-01-18 MED ORDER — ACETAMINOPHEN 325 MG PO TABS: 650.0000 mg | ORAL_TABLET | Freq: Four times a day (QID) | ORAL | Status: DC | PRN

## 2022-01-18 MED ORDER — ONDANSETRON HCL 4 MG/2ML IJ SOLN
4.0000 mg | Freq: Three times a day (TID) | INTRAMUSCULAR | Status: DC | PRN
  Administered 2022-01-19: 02:00:00 4 mg via INTRAVENOUS
  Filled 2022-01-18 (×2): qty 2

## 2022-01-18 MED ORDER — IOHEXOL 300 MG/ML  SOLN
80.0000 mL | Freq: Once | INTRAMUSCULAR | Status: AC | PRN
  Administered 2022-01-18: 80 mL via INTRAVENOUS

## 2022-01-18 MED ORDER — HYDROMORPHONE HCL 1 MG/ML IJ SOLN
1.0000 mg | INTRAMUSCULAR | Status: DC | PRN
  Administered 2022-01-18 – 2022-01-19 (×2): 1 mg via INTRAVENOUS
  Filled 2022-01-18 (×2): qty 1

## 2022-01-18 MED ORDER — HYDROMORPHONE HCL 1 MG/ML IJ SOLN
1.0000 mg | Freq: Once | INTRAMUSCULAR | Status: AC
  Administered 2022-01-18: 1 mg via INTRAVENOUS
  Filled 2022-01-18: qty 1

## 2022-01-18 MED ORDER — DIATRIZOATE MEGLUMINE & SODIUM 66-10 % PO SOLN
90.0000 mL | Freq: Once | ORAL | Status: AC
  Administered 2022-01-18: 90 mL via ORAL

## 2022-01-18 MED ORDER — ONDANSETRON HCL 4 MG/2ML IJ SOLN
4.0000 mg | Freq: Once | INTRAMUSCULAR | Status: AC | PRN
  Administered 2022-01-18: 4 mg via INTRAVENOUS
  Filled 2022-01-18: qty 2

## 2022-01-18 NOTE — ED Notes (Signed)
GI/Surgery at [facility]

## 2022-01-18 NOTE — ED Notes (Signed)
EDP at [facility] discussing CT results, "partial SBO". Pain and nausea improved. Endorses "feel bad, indigestion". Returned to monitor. No further emesis.

## 2022-01-18 NOTE — ED Provider Notes (Signed)
[facility] Provider Note    Event Date/Time   First MD Initiated Contact with Patient 01/18/22 [phone]     (approximate)   History   Abdominal Pain   HPI  Alyssa Sosa is a 61 y.o. female who is otherwise healthy comes in with abdominal pain.  Patient reports epigastric pain that started during the night with some nausea.  Patient reports that the pain woke her up from sleep at 1:00.  She reports that initially felt like gas but she states that she cannot get the gas out.  She reports that she has been not been able to pass any gas or any stool for 24 hours.  She denies any abdominal surgeries performed.  Does report 1 episode of vomiting that was associated with what she had eaten previously.  It was nonbloody nonbilious.  She reports some continued abdominal pain throughout her abdomen that comes in waves.  She does report that when a wave comes on she develops a headache with it but she denies any headaches without the abdominal pain.  Denies any weight loss, headaches waking her up from sleep, neurodeficits.   Physical Exam   Triage Vital Signs: ED Triage Vitals  Enc Vitals Group     BP 01/18/22 0510 (!) 149/93     Pulse Rate 01/18/22 0510 77     Resp 01/18/22 0510 16     Temp 01/18/22 0510 97.8 F (36.6 C)     Temp Source 01/18/22 0510 Oral     SpO2 01/18/22 0510 97 %     Weight 01/18/22 0511 120 lb (54.4 kg)     Height 01/18/22 0511 5\' 2"  (1.575 m)     Head Circumference --      Peak Flow --      Pain Score 01/18/22 0507 10     Pain Loc --      Pain Edu? --      Excl. in [place]? --     Most recent vital signs: Vitals:   01/18/22 0510  BP: (!) 149/93  Pulse: 77  Resp: 16  Temp: 97.8 F (36.6 C)  SpO2: 97%     General: Awake, no distress. Well appearing but with waves of pain CV:  Good peripheral perfusion.  Resp:  Normal effort.  Abd:  No distention. Tender throughout abdomen Other:  Cn 2-12 intact, equal strength in arms and legs.  Normal sensation    ED Results / Procedures / Treatments   Labs (all labs ordered are listed, but only abnormal results are displayed) Labs Reviewed  COMPREHENSIVE METABOLIC PANEL - Abnormal; Notable for the following components:      Result Value   Glucose, Bld 138 (*)    BUN 23 (*)    Calcium 10.4 (*)    All other components within normal limits  CBC - Abnormal; Notable for the following components:   HCT 46.1 (*)    All other components within normal limits  POC URINE PREG, ED - Normal  LIPASE, BLOOD  URINALYSIS, ROUTINE W REFLEX MICROSCOPIC  TROPONIN I (HIGH SENSITIVITY)  TROPONIN I (HIGH SENSITIVITY)     EKG  My interpretation of EKG: Normal sinus rate of 76 without any ST elevation or T wave inversions, normal intervals   RADIOLOGY I have reviewed the CT personally but pending rads read    PROCEDURES:  Critical Care performed: No  .1-3 Lead EKG Interpretation Performed by: Vanessa Newport, MD Authorized by: Marjean Donna  E, MD     Interpretation: normal     ECG rate:  70   ECG rate assessment: normal     Rhythm: sinus rhythm     Ectopy: none     Conduction: normal     MEDICATIONS ORDERED IN ED: Medications  ondansetron (ZOFRAN) injection 4 mg (4 mg Intravenous Given 01/18/22 0527)  fentaNYL (SUBLIMAZE) injection 50 mcg (50 mcg Intravenous Given 01/18/22 0929)  sodium chloride 0.9 % bolus 1,000 mL (0 mLs Intravenous Stopped 01/18/22 0640)  iohexol (OMNIPAQUE) 300 MG/ML solution 80 mL (80 mLs Intravenous Contrast Given 01/18/22 0813)  ondansetron (ZOFRAN) injection 4 mg (4 mg Intravenous Given 01/18/22 0739)  HYDROmorphone (DILAUDID) injection 0.5 mg (0.5 mg Intravenous Given 01/18/22 1001)  pantoprazole (PROTONIX) injection 40 mg (40 mg Intravenous Given 01/18/22 1001)     IMPRESSION / MDM / ASSESSMENT AND PLAN / ED COURSE  I reviewed the triage vital signs and the nursing notes.     Patient comes in with abdominal pain with episode of vomiting with  inability to pass any gas. Differential diagnosis includes, but is not limited to, constipation, perforation, diverticulitis, kidney stone, constipation.  Will get CT scan to further evaluate.  Given patient was initially reporting pain when the upper abdomen ultrasound was ordered in triage to evaluate for any gallstones.  Patient was given some IV fentanyl and IV Zofran to help with symptoms and got 1 L of fluids  Pregnancy test is negative Lipase is normal CMP is normal slightly elevated calcium CBC shows no anemia Initial troponin was negative  Ultrasound is negative for gallstones.  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 D/w surg Dr. Lysle Pearl recommend NG if starts vomiting but okay to hold off now and d/w hospital.  D/w hospitalist and will admit.    The patient is on the cardiac monitor to evaluate for evidence of arrhythmia and/or significant heart rate changes.  FINAL CLINICAL IMPRESSION(S) / ED DIAGNOSES   Final diagnoses:  Partial small bowel obstruction ([place])     Rx / DC Orders   ED Discharge Orders     None        Note:  This document was prepared using Dragon voice recognition software and may include unintentional dictation errors.   Vanessa Marion, MD 01/18/22 1029

## 2022-01-18 NOTE — ED Notes (Signed)
Pain and nausea present, increasing. Pt finds difficult to explain/ describe/ rate

## 2022-01-18 NOTE — H&P (Signed)
History and Physical    Patient: Alyssa Sosa FIE:[id number] DOB: 11-04-1961 DOA: 01/18/2022 DOS: the patient was seen and examined on 01/18/2022 PCP: Newell Coral, PA-C   Patient coming from: Home   Chief Complaint: Abdominal pain, nausea, vomiting  HPI: Alyssa Sosa is a 61 y.o. female with medical history significant of tobacco use, depression, who presents with abdominal pain, nausea, vomiting.  Patient states that her symptoms started last night, including epigastric abdominal pain, nausea and one episode of nonbilious nonbloody vomiting.  The abdominal pain is constant, intermittently accentuated, severe, nonradiating.  Last bowel movement was 24 hours ago.  Patient denies history of abdominal surgery.  Has chills, but no fever.  Denies chest pain, cough, shortness breath.  No symptoms of UTI.  Data review and ED course: I have personally reviewed labs and imaging studies.  WBC 10.0, troponin level 2 --> 2, negative COVID PCR, electrolytes renal function okay, calcium 10.4, temperature 99, blood pressure 155/86, heart rate 81, RR 21, oxygen saturation 99% on room air.  CT of abdomen/pelvis showed partial small bowel obstruction and possible liver hemangiomas..  Patient is admitted to [facility] bed as inpatient.  Dr. Lysle Pearl of general surgery is consulted.  CT-abd/pelvis: Partial small bowel obstruction in the region of the ileum. Small amount of associated free fluid. Exact etiology not identified.   Scattered liver lesions consistent with hemangiomas by ultrasound and CT.   Aortic Atherosclerosis (ICD10-I70.0).  US-RUQ: 1. No acute sonographic findings within the RIGHT upper quadrant. 2. Rounded hepatic masses, largest within the RIGHT hepatic lobe measuring 2.4 cm, are favored consistent with hepatic hemangiomas.   EKG: Reviewed KEG independently.  Sinus rhythm, QTC 433, T wave inversion in V1-V2, early R wave progression.  Review of Systems:   General: no fevers,  chills, no body weight gain, has fatigue HEENT: no blurry vision, hearing changes or sore throat Respiratory: no dyspnea, coughing, wheezing CV: no chest pain, no palpitations GI: has nausea, vomiting, abdominal pain, no diarrhea, constipation GU: no dysuria, burning on urination, increased urinary frequency, hematuria  Ext: no leg edema Neuro: no unilateral weakness, numbness, or tingling, no vision change or hearing loss Skin: no rash, no skin tear. MSK: No muscle spasm, no deformity, no limitation of range of movement in spin Heme: No easy bruising.  Travel history: No recent long distant travel.   Past Medical History:  Diagnosis Date   Depression    Past Surgical History:  Procedure Laterality Date   AMPUTATION FINGER Right    index finger   AUGMENTATION MAMMAPLASTY Bilateral 1660   silicone   COLONOSCOPY WITH PROPOFOL N/A 03/04/2021   Procedure: COLONOSCOPY WITH PROPOFOL;  Surgeon: Lin Landsman, MD;  Location: [facility];  Service: Endoscopy;  Laterality: N/A;   TMJ ARTHROPLASTY     Social History:  reports that she has been smoking cigarettes. She has never used smokeless tobacco. She reports that she does not drink alcohol and does not use drugs.  Allergies  Allergen Reactions   Tyloxapol Nausea And Vomiting    Family History  Problem Relation Age of Onset   Colon cancer Father 55   Melanoma Sister 15    Prior to Admission medications   Medication Sig Start Date End Date Taking? Authorizing Provider  conjugated estrogens (PREMARIN) vaginal cream Place 1 Applicatorful vaginally 2 (two) times a week. 01/19/21   Malachy Mood, MD  DULoxetine (CYMBALTA) 30 MG capsule Take 30 mg by mouth daily. 11/26/20   [provider]  ergocalciferol (VITAMIN D2) 1.25 MG (50000 UT) capsule ergocalciferol (vitamin D2) 1,250 mcg (50,000 unit) capsule    [provider]    Physical Exam: Vitals:   01/18/22 1845 01/18/22 1900 01/18/22 1915 01/18/22  1930  BP:  (!) 171/103    Pulse: (!) 55 (!) 58 69 62  Resp: 16 19 (!) 22 14  Temp:      TempSrc:      SpO2: 94% 98% 96% 95%  Weight:      Height:       Physical exam:  General: Not in acute distress HEENT:       Eyes: PERRL, EOMI, no scleral icterus.       ENT: No discharge from the ears and nose, no pharynx injection, no tonsillar enlargement.        Neck: No JVD, no bruit, no mass felt. Heme: No neck lymph node enlargement. Cardiac: S1/S2, RRR, No murmurs, No gallops or rubs. Respiratory: No rales, wheezing, rhonchi or rubs. GI: Soft, nondistended, has tenderness in epigastric area, no rebound pain, no organomegaly, BS present. GU: No hematuria Ext: No pitting leg edema bilaterally. 1+DP/PT pulse bilaterally. Musculoskeletal: No joint deformities, No joint redness or warmth, no limitation of ROM in spin. Skin: No rashes.  Neuro: Alert, oriented X3, cranial nerves II-XII grossly intact, moves all extremities normally.  Psych: Patient is not psychotic, no suicidal or hemocidal ideation.   Assessment/Plan Principal Problem:   Partial small bowel obstruction (HCC) Active Problems:   Depressive disorder   Tobacco use   Liver hemangioma   Hypercalcemia    * Partial small bowel obstruction (HCC)- (present on admission) CT scan showed partial small bowel obstruction in the region of the ileum. Dr. Lysle Pearl of surgery is consulted. May need to r/o other etiology. Hold NG tube now since patient does not have active vomiting.  If patient gets worse, will start NG tube.   -Admitted to MedSurg bed as inpatient -NPO -As needed morphine and Zofran -IV fluid: 1 L normal saline, then 100 cc/h of LR   Depressive disorder- (present on admission) - Continue home Cymbalta  Tobacco use- (present on admission) - Did counseling about importance of quitting tobacco use -Nicotine patch  Liver hemangioma- (present on admission) Both CT scan and right upper quadrant abdominal ultrasound  showed liver lesions, which are likely due to hemangioma -Follow-up with PCP  Hypercalcemia- (present on admission) Calcium 10.4, very minimally elevated, most likely due to dehydration -IV fluid as above    Advance Care Planning:   Code Status: Full Code   Consults: Dr. Lysle Pearl of surgery  Family Communication: husband at bedside  DVT ppx:  SCD    Severity of Illness: The appropriate patient status for this patient is INPATIENT. Inpatient status is judged to be reasonable and necessary in order to provide the required intensity of service to ensure the patient's safety. The patient's presenting symptoms, physical exam findings, and initial radiographic and laboratory data in the context of their chronic comorbidities is felt to place them at high risk for further clinical deterioration. Furthermore, it is not anticipated that the patient will be medically stable for discharge from the hospital within 2 midnights of admission.   * I certify that at the point of admission it is my clinical judgment that the patient will require inpatient hospital care spanning beyond 2 midnights from the point of admission due to high intensity of service, high risk for further deterioration and high frequency of  surveillance required.*   Author: Ivor Costa, MD 01/18/2022 7:42 PM  For on call review [email or web address].

## 2022-01-18 NOTE — Assessment & Plan Note (Signed)
CT scan showed partial small bowel obstruction in the region of the ileum. Dr. Lysle Pearl of surgery is consulted. May need to r/o other etiology. Hold NG tube now since patient does not have active vomiting.  If patient gets worse, will start NG tube.   -Admitted to MedSurg bed as inpatient -NPO -As needed morphine and Zofran -IV fluid: 1 L normal saline, then 100 cc/h of LR

## 2022-01-18 NOTE — ED Notes (Signed)
Pain decreased, nausea resolved, pt to CT.

## 2022-01-18 NOTE — Consult Note (Signed)
Subjective:   CC: SBO  HPI:  Alyssa Sosa is a 61 y.o. female who was consulted by [facility] for issue above.  Symptoms were first noted 1 day ago. Pain is sharp, confined to the suprapubic area, without radiation.  Associated with N/V, exacerbated by touch.  Usually has daily BM. No flatus or BM since pain started all of a sudden yesterday.       Past Medical History:  has a past medical history of Depression.  Past Surgical History:  Past Surgical History:  Procedure Laterality Date   AMPUTATION FINGER Right    index finger   AUGMENTATION MAMMAPLASTY Bilateral 6712   silicone   COLONOSCOPY WITH PROPOFOL N/A 03/04/2021   Procedure: COLONOSCOPY WITH PROPOFOL;  Surgeon: Lin Landsman, MD;  Location: [facility];  Service: Endoscopy;  Laterality: N/A;   TMJ ARTHROPLASTY      Family History: family history includes Colon cancer (age of onset: 14) in her father; Melanoma (age of onset: 32) in her sister.  Social History:  reports that she has been smoking cigarettes. She has never used smokeless tobacco. She reports that she does not drink alcohol and does not use drugs.  Current Medications:  Prior to Admission medications   Medication Sig Start Date End Date Taking? Authorizing Provider  conjugated estrogens (PREMARIN) vaginal cream Place 1 Applicatorful vaginally 2 (two) times a week. 01/19/21   Malachy Mood, MD  DULoxetine (CYMBALTA) 30 MG capsule Take 30 mg by mouth daily. 11/26/20   [provider]  ergocalciferol (VITAMIN D2) 1.25 MG (50000 UT) capsule ergocalciferol (vitamin D2) 1,250 mcg (50,000 unit) capsule    [provider]    Allergies:  Allergies as of 01/18/2022   (No Known Allergies)    ROS:  General: Denies weight loss, weight gain, fatigue, fevers, chills, and night sweats. Eyes: Denies blurry vision, double vision, eye pain, itchy eyes, and tearing. Ears: Denies hearing loss, earache, and ringing in ears. Nose: Denies sinus  pain, congestion, infections, runny nose, and nosebleeds. Mouth/throat: Denies hoarseness, sore throat, bleeding gums, and difficulty swallowing. Heart: Denies chest pain, palpitations, racing heart, irregular heartbeat, leg pain or swelling, and decreased activity tolerance. Respiratory: Denies breathing difficulty, shortness of breath, wheezing, cough, and sputum. GI: Denies change in appetite, heartburn,  constipation, diarrhea, and blood in stool. GU: Denies difficulty urinating, pain with urinating, urgency, frequency, blood in urine. Musculoskeletal: Denies joint stiffness, pain, swelling, muscle weakness. Skin: Denies rash, itching, mass, tumors, sores, and boils Neurologic: Denies headache, fainting, dizziness, seizures, numbness, and tingling. Psychiatric: Denies depression, anxiety, difficulty sleeping, and memory loss. Endocrine: Denies heat or cold intolerance, and increased thirst or urination. Blood/lymph: Denies easy bruising, easy bruising, and swollen glands     Objective:     BP (!) 165/95    Pulse 68    Temp 99 F (37.2 C) (Oral)    Resp 14    Ht 5\' 2"  (1.575 m)    Wt 54.4 kg    SpO2 94%    BMI 21.95 kg/m   Constitutional :  alert, cooperative, appears stated age, and no distress  Lymphatics/Throat:  no asymmetry, masses, or scars  Respiratory:  clear to auscultation bilaterally  Cardiovascular:  regular rate and rhythm  Gastrointestinal: Soft, no guarding, but focal TTP in suprapubic region .   Musculoskeletal: Steady movement  Skin: Cool and moist, no surgical scars   Psychiatric: Normal affect, non-agitated, not confused       LABS:  CMP Latest  Ref Rng & Units 01/18/2022  Glucose 70 - 99 mg/dL 138(H)  BUN 6 - 20 mg/dL 23(H)  Creatinine 0.44 - 1.00 mg/dL 0.67  Sodium 135 - 145 mmol/L 139  Potassium 3.5 - 5.1 mmol/L 4.2  Chloride 98 - 111 mmol/L 103  CO2 22 - 32 mmol/L 26  Calcium 8.9 - 10.3 mg/dL 10.4(H)  Total Protein 6.5 - 8.1 g/dL 7.2  Total Bilirubin  0.3 - 1.2 mg/dL 0.4  Alkaline Phos 38 - 126 U/L 79  AST 15 - 41 U/L 19  ALT 0 - 44 U/L 15   CBC Latest Ref Rng & Units 01/18/2022  WBC 4.0 - 10.5 K/uL 10.0  Hemoglobin 12.0 - 15.0 g/dL 15.0  Hematocrit 36.0 - 46.0 % 46.1(H)  Platelets 150 - 400 K/uL 331    RADS: CLINICAL DATA:  Abdominal pain, acute, nonlocalized. Epigastric pain beginning last night. Nausea.   EXAM: CT ABDOMEN AND PELVIS WITH CONTRAST   TECHNIQUE: Multidetector CT imaging of the abdomen and pelvis was performed using the standard protocol following bolus administration of intravenous contrast.   RADIATION DOSE REDUCTION: This exam was performed according to the departmental dose-optimization program which includes automated exposure control, adjustment of the mA and/or kV according to patient size and/or use of iterative reconstruction technique.   CONTRAST:  23mL OMNIPAQUE IOHEXOL 300 MG/ML  SOLN   COMPARISON:  Ultrasound same day   FINDINGS: Lower chest: No active or significant finding. Minimal chronic scarring at the left lung base.   Hepatobiliary: Within the central portion of the right lobe of the liver, there is a 7 x 2 mm low-density area with some peripheral contrast puddling. Below that within the posterior segment of the right lobe there is a 2 cm low-density liver lesion with peripheral contrast puddling. At the very caudal tip of the right lobe of the liver there are 2 small foci measuring 7-8 mm in size of low-density. In correlation with the ultrasound, it is quite likely that all these lesions are benign hemangiomas. No calcified gallstones. No ductal dilatation.   Pancreas: Normal   Spleen: Normal   Adrenals/Urinary Tract: Adrenal glands are normal. Kidneys are normal except for an insignificant 1 cm cyst on the left. Bladder is normal.   Stomach/Bowel: Stomach is normal. Proximal small bowel is normal, possibly decompressed by vomiting. There are numerous dilated loops of  ileum with air-fluid levels and fecalized material, consistent with small bowel obstruction. Colon is normal. Appendix is normal.   Vascular/Lymphatic: Aortic atherosclerosis. No aneurysm. IVC is normal. No adenopathy.   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   Other: Small amount ascites, freely distributed.   Musculoskeletal: Lower lumbar degenerative disc disease and degenerative facet disease which could be symptomatic.   IMPRESSION: Partial small bowel obstruction in the region of the ileum. Small amount of associated free fluid. Exact etiology not identified.   Scattered liver lesions consistent with hemangiomas by ultrasound and CT.   Aortic Atherosclerosis (ICD10-I70.0).     Electronically Signed   By: Nelson Chimes M.D.   On: 01/18/2022 08:33 Assessment:   SBO, unknown etiology at this time.  Plan:    Recommend SBFT and serial abd exams to monitor for resolution.  No obvious etiology and degree of fecalization within ileum concerning, but will see how she does in next day or two.  IVF, NPO in the meantime.  NG tube if she starts getting nauseated again.

## 2022-01-18 NOTE — ED Notes (Signed)
Husband at [facility]

## 2022-01-18 NOTE — ED Notes (Signed)
Pt up to bathroom with assistance. Pain5/10  meds given for pain again.  Pt alert.

## 2022-01-18 NOTE — Assessment & Plan Note (Signed)
Both CT scan and right upper quadrant abdominal ultrasound showed liver lesions, which are likely due to hemangioma -Follow-up with PCP

## 2022-01-18 NOTE — Assessment & Plan Note (Signed)
Calcium 10.4, very minimally elevated, most likely due to dehydration -IV fluid as above

## 2022-01-18 NOTE — ED Notes (Signed)
Pain and nausea resolved.

## 2022-01-18 NOTE — ED Notes (Addendum)
Xray notified gastrograffin finished, timed xray scheduled for 2045.

## 2022-01-18 NOTE — ED Notes (Signed)
Pt still in US at this time.

## 2022-01-18 NOTE — Assessment & Plan Note (Addendum)
-   Continue home Cymbalta

## 2022-01-18 NOTE — ED Triage Notes (Addendum)
Pt arrived via POV with c/o epigastric pain that started during the night, pt c/o nausea. No distress noted on arrival. Pt states the pain feels like gas.  Pt reports the sxs began around 1pm yesterday.

## 2022-01-18 NOTE — Assessment & Plan Note (Signed)
-   Did counseling about importance of quitting tobacco use -Nicotine patch

## 2022-01-18 NOTE — ED Notes (Signed)
Pt actively vomiting in triage 

## 2022-01-18 NOTE — ED Notes (Signed)
Pt ambulatory back from b/r, steady gait, husband at [facility]. PT alert, NAD, calm, interactive, resps e/u, speaking in clear complete sentences. Reports pain and nausea returning. Meds ordered. EDP at [facility].

## 2022-01-18 NOTE — ED Notes (Signed)
C/o pain and nausea increasing

## 2022-01-18 NOTE — ED Notes (Signed)
Pt placed in subwait to receive IV fluids and for comfort, warm blanket give, plan of care discussed.

## 2022-01-19 ENCOUNTER — Inpatient Hospital Stay: Payer: BC Managed Care – PPO

## 2022-01-19 LAB — BASIC METABOLIC PANEL
Anion gap: 7 (ref 5–15)
BUN: 25 mg/dL — ABNORMAL HIGH (ref 6–20)
CO2: 29 mmol/L (ref 22–32)
Calcium: 9.1 mg/dL (ref 8.9–10.3)
Chloride: 104 mmol/L (ref 98–111)
Creatinine, Ser: 0.55 mg/dL (ref 0.44–1.00)
GFR, Estimated: >60 mL/min (ref >60)
Glucose, Bld: 113 mg/dL — ABNORMAL HIGH (ref 70–99)
Potassium: 4.6 mmol/L (ref 3.5–5.1)
Sodium: 140 mmol/L (ref 135–145)

## 2022-01-19 LAB — CBC WITH DIFFERENTIAL/PLATELET
Abs Immature Granulocytes: 0.04 10*3/uL (ref 0.00–0.07)
Basophils Absolute: 0 10*3/uL (ref 0.0–0.1)
Basophils Relative: 0 %
Eosinophils Absolute: 0 10*3/uL (ref 0.0–0.5)
Eosinophils Relative: 0 %
HCT: 43 % (ref 36.0–46.0)
Hemoglobin: 13.8 g/dL (ref 12.0–15.0)
Immature Granulocytes: 0 %
Lymphocytes Relative: 12 %
Lymphs Abs: 1.3 10*3/uL (ref 0.7–4.0)
MCH: 30.7 pg (ref 26.0–34.0)
MCHC: 32.1 g/dL (ref 30.0–36.0)
MCV: 95.6 fL (ref 80.0–100.0)
Monocytes Absolute: 0.5 10*3/uL (ref 0.1–1.0)
Monocytes Relative: 5 %
Neutro Abs: 9.1 10*3/uL — ABNORMAL HIGH (ref 1.7–7.7)
Neutrophils Relative %: 83 %
Platelets: 288 10*3/uL (ref 150–400)
RBC: 4.5 MIL/uL (ref 3.87–5.11)
RDW: 13.2 % (ref 11.5–15.5)
WBC: 10.9 10*3/uL — ABNORMAL HIGH (ref 4.0–10.5)
nRBC: 0 % (ref 0.0–0.2)

## 2022-01-19 LAB — GLUCOSE, CAPILLARY
Glucose-Capillary: 115 mg/dL — ABNORMAL HIGH (ref 70–99)
Glucose-Capillary: 87 mg/dL (ref 70–99)
Glucose-Capillary: 80 mg/dL (ref 70–99)

## 2022-01-19 LAB — PHOSPHORUS: Phosphorus: 3.5 mg/dL (ref 2.5–4.6)

## 2022-01-19 LAB — MAGNESIUM: Magnesium: 2.4 mg/dL (ref 1.7–2.4)

## 2022-01-19 MED ORDER — BOOST / RESOURCE BREEZE PO LIQD CUSTOM
1.0000 | Freq: Three times a day (TID) | ORAL | Status: DC
  Administered 2022-01-20 – 2022-01-21 (×3): 1 via ORAL

## 2022-01-19 MED ORDER — ENOXAPARIN SODIUM 40 MG/0.4ML IJ SOSY
40.0000 mg | PREFILLED_SYRINGE | INTRAMUSCULAR | Status: DC
  Administered 2022-01-19 – 2022-01-20 (×2): 40 mg via SUBCUTANEOUS
  Filled 2022-01-19 (×2): qty 0.4

## 2022-01-19 MED ORDER — ONDANSETRON HCL 4 MG/2ML IJ SOLN: 4.0000 mg | Freq: Four times a day (QID) | INTRAMUSCULAR | Status: DC | PRN

## 2022-01-19 MED ORDER — MORPHINE SULFATE (PF) 2 MG/ML IV SOLN
2.0000 mg | INTRAVENOUS | Status: DC | PRN
  Administered 2022-01-19 (×3): 2 mg via INTRAVENOUS
  Filled 2022-01-19 (×3): qty 1

## 2022-01-19 MED ORDER — HYDRALAZINE HCL 50 MG PO TABS: 25.0000 mg | ORAL_TABLET | Freq: Four times a day (QID) | ORAL | Status: DC | PRN

## 2022-01-19 MED ORDER — ADULT MULTIVITAMIN W/MINERALS CH
1.0000 | ORAL_TABLET | Freq: Every day | ORAL | Status: DC
  Administered 2022-01-20 – 2022-01-21 (×2): 1 via ORAL
  Filled 2022-01-19 (×2): qty 1

## 2022-01-19 NOTE — Clinical Social Work Note (Signed)
°  Transition of Care (TOC) Screening Note   Patient Details  Name: Alyssa Sosa Date of Birth: 10/09/1961   Transition of Care [facility]) CM/SW Contact:    Eileen Stanford, LCSW Phone [place] 01/19/2022, 8:48 AM    Transition of Care Department (TOC) has reviewed patient and no TOC needs have been identified at this time. We will continue to monitor patient advancement through interdisciplinary progression rounds. If new patient transition needs arise, please place a TOC consult.

## 2022-01-19 NOTE — Progress Notes (Signed)
PROGRESS NOTE    Alyssa Sosa  ENI:[id number] DOB: Nov 06, 1961 DOA: 01/18/2022 PCP: Newell Coral, PA-C   Chief Complaint  Patient presents with   Abdominal Pain  Brief Narrative/Hospital Course: Alyssa Sosa Limes, 61 y.o. female with PMH of  tobacco use, depression, who presents with abdominal pain, nausea, vomiting onset 1/29 night along with epigastric abdominal pain,. Abdominal pain is constant, intermittently accentuated, severe, nonradiating.  Last bowel movement was 24 hours PTA Seen in the ED underwent imaging and lab that showed partial small bowel obstruction in the region of the ileum, scattered liver lesion and ultrasound showed hemangioma on the right side, patient was admitted General surgery consulted  Subjective: Passing flatus Had sever abd pain but improved with pain meds- gets nauseas when pain is bad Overnight blood pressure in 150s to 170s, on room air, otherwise stable vital signs and is stable labs  Assessment & Plan:  Partial SBO: Presents with nausea vomiting, CT and imaging showed partial SBO with region of ileum.  General surgery consulted.  Continue IVF, n.p.o., patient complains of Going intermittent abdominal pain but cannot tolerate pain medication.  Continue plan of care as per general surgery she seems to be passing flatus likely resolving  Hypercalcemia:Calcium 10.4->9.1likely from dehydration.Resolved  Tobacco use: Cessation counseling and nicotine patch offered  Right Liver hemangioma: Seen on CT abdomen and ultrasound.  Outpatient follow-up  Depressive disorder:cont home Cymbalta  DVT prophylaxis: enoxaparin (LOVENOX) injection 40 mg Start: 01/19/22 2200 SCDs Start: 01/18/22 1031 Code Status:   Code Status: Full Code Family Communication: plan of care discussed with patient at bedside. Status is: Inpatient Remains inpatient appropriate because: For ongoing management of SBO Planned Discharge Destination: Home Disposition: Currently not  medically stable for discharge. Anticipated Disposition: once sbo resolves  Total time spent in the care of this patient 50 MINUTES Objective: Vitals last 24 hrs: Vitals:   01/18/22 2145 01/18/22 2200 01/19/22 0525 01/19/22 0808  BP:  (!) 158/89 (!) 173/108 (!) 171/101  Pulse: 72 67 81 77  Resp:  15 18 18   Temp:   98.9 F (37.2 C) 98.8 F (37.1 C)  TempSrc:   Oral   SpO2: 97% 96% 97% 96%  Weight:      Height:       Weight change:   Intake/Output Summary (Last 24 hours) at 01/19/2022 0900 Last data filed at 01/18/2022 1110 Gross per 24 hour  Intake 700 ml  Output --  Net 700 ml   Net IO Since Admission: 1,200 mL [01/19/22 0900]   Physical Examination: General exam: AA0x3, weak,older than stated age. HEENT:Oral mucosa moist, Ear/Nose WNL grossly,dentition normal. Respiratory system: B/l clear BS, no use of accessory muscle, non tender. Cardiovascular system: S1 & S2 +,No JVD. Gastrointestinal system: Abdomen soft, mildly tender lower abdomen,ND, BS+. Nervous System:Alert, awake, moving extremities. Extremities: edema none, distal peripheral pulses palpable.  Skin: No rashes, no icterus. MSK: Normal muscle bulk, tone, power.  Medications reviewed:  Scheduled Meds:  DULoxetine  30 mg Oral Daily   enoxaparin (LOVENOX) injection  40 mg Subcutaneous Q24H   nicotine  21 mg Transdermal Daily   Continuous Infusions:  lactated ringers 100 mL/hr at 01/19/22 0220   Diet Order             Diet NPO time specified Except for: Sips with Meds  Diet effective now                  Weight change:   Wt Readings from  Last 3 Encounters:  01/18/22 54.4 kg  03/04/21 50.8 kg  01/16/21 57.3 kg     Consultants:see note  Procedures:see note Antimicrobials: Anti-infectives (From admission, onward)    None      Culture/Microbiology No results found for: SDES, SPECREQUEST, CULT, REPTSTATUS  Other culture-see note  Unresulted Labs (From admission, onward)     Start      Ordered   01/26/22 0500  Creatinine, serum  (enoxaparin (LOVENOX)    CrCl >/= 30 ml/min)  Weekly,   TIMED     Comments: while on enoxaparin therapy   Question:  Specimen collection method  Answer:  Lab=Lab collect   01/19/22 0753   01/20/22 8341  Basic metabolic panel  Daily,   R     Question:  Specimen collection method  Answer:  Lab=Lab collect   01/19/22 0752   01/19/22 0500  Magnesium  Daily,   STAT     Question:  Specimen collection method  Answer:  Lab=Lab collect   01/18/22 1357   01/19/22 0500  Phosphorus  Daily,   STAT     Question:  Specimen collection method  Answer:  Lab=Lab collect   01/18/22 1357          Data Reviewed: I have personally reviewed following labs and imaging studies CBC: Recent Labs  Lab 01/18/22 0531 01/19/22 0520  WBC 10.0 10.9*  NEUTROABS  --  9.1*  HGB 15.0 13.8  HCT 46.1* 43.0  MCV 95.6 95.6  PLT 331 962   Basic Metabolic Panel: Recent Labs  Lab 01/18/22 0531 01/19/22 0520  NA 139 140  K 4.2 4.6  CL 103 104  CO2 26 29  GLUCOSE 138* 113*  BUN 23* 25*  CREATININE 0.67 0.55  CALCIUM 10.4* 9.1  MG  --  2.4  PHOS  --  3.5   GFR: Estimated Creatinine Clearance: 59.9 mL/min (by C-G formula based on SCr of 0.55 mg/dL). Liver Function Tests: Recent Labs  Lab 01/18/22 0531  AST 19  ALT 15  ALKPHOS 79  BILITOT 0.4  PROT 7.2  ALBUMIN 4.1   Recent Labs  Lab 01/18/22 0531  LIPASE 27   No results for input(s): AMMONIA in the last 168 hours. Coagulation Profile: Recent Labs  Lab 01/18/22 1102  INR 0.9   Cardiac Enzymes: No results for input(s): CKTOTAL, CKMB, CKMBINDEX, TROPONINI in the last 168 hours. BNP (last 3 results) No results for input(s): PROBNP in the last 8760 hours. HbA1C: No results for input(s): HGBA1C in the last 72 hours. CBG: Recent Labs  Lab 01/19/22 0839  GLUCAP 115*   Lipid Profile: No results for input(s): CHOL, HDL, LDLCALC, TRIG, CHOLHDL, LDLDIRECT in the last 72 hours. Thyroid Function  Tests: No results for input(s): TSH, T4TOTAL, FREET4, T3FREE, THYROIDAB in the last 72 hours. Anemia Panel: No results for input(s): VITAMINB12, FOLATE, FERRITIN, TIBC, IRON, RETICCTPCT in the last 72 hours. Sepsis Labs: No results for input(s): PROCALCITON, LATICACIDVEN in the last 168 hours.  Recent Results (from the past 240 hour(s))  Resp Panel by RT-PCR (Flu A&B, Covid) Nasopharyngeal Swab     Status: None   Collection Time: 01/18/22  7:36 AM   Specimen: Nasopharyngeal Swab; Nasopharyngeal(NP) swabs in vial transport medium  Result Value Ref Range Status   SARS Coronavirus 2 by RT PCR NEGATIVE NEGATIVE Final    Comment: (NOTE) SARS-CoV-2 target nucleic acids are NOT DETECTED.  The SARS-CoV-2 RNA is generally detectable in upper respiratory specimens during the acute phase  of infection. The lowest concentration of SARS-CoV-2 viral copies this assay can detect is 138 copies/mL. A negative result does not preclude SARS-Cov-2 infection and should not be used as the sole basis for treatment or other patient management decisions. A negative result may occur with  improper specimen collection/handling, submission of specimen other than nasopharyngeal swab, presence of viral mutation(s) within the areas targeted by this assay, and inadequate number of viral copies(<138 copies/mL). A negative result must be combined with clinical observations, patient history, and epidemiological information. The expected result is Negative.  Fact Sheet for Patients:  EntrepreneurPulse.com.au  Fact Sheet for Healthcare Providers:  IncredibleEmployment.be  This test is no t yet approved or cleared by the [place] FDA and  has been authorized for detection and/or diagnosis of SARS-CoV-2 by FDA under an Emergency Use Authorization (EUA). This EUA will remain  in effect (meaning this test can be used) for the duration of the COVID-19 declaration under Section  564(b)(1) of the Act, 21 U.S.C.section 360bbb-3(b)(1), unless the authorization is terminated  or revoked sooner.       Influenza A by PCR NEGATIVE NEGATIVE Final   Influenza B by PCR NEGATIVE NEGATIVE Final    Comment: (NOTE) The Xpert Xpress SARS-CoV-2/FLU/RSV plus assay is intended as an aid in the diagnosis of influenza from Nasopharyngeal swab specimens and should not be used as a sole basis for treatment. Nasal washings and aspirates are unacceptable for Xpert Xpress SARS-CoV-2/FLU/RSV testing.  Fact Sheet for Patients: EntrepreneurPulse.com.au  Fact Sheet for Healthcare Providers: IncredibleEmployment.be  This test is not yet approved or cleared by the [place] FDA and has been authorized for detection and/or diagnosis of SARS-CoV-2 by FDA under an Emergency Use Authorization (EUA). This EUA will remain in effect (meaning this test can be used) for the duration of the COVID-19 declaration under Section 564(b)(1) of the Act, 21 U.S.C. section 360bbb-3(b)(1), unless the authorization is terminated or revoked.  Performed at [facility], [address] [place], [place] [address]      Radiology Studies: CT ABDOMEN PELVIS W CONTRAST  Result Date: 01/18/2022 CLINICAL DATA:  Abdominal pain, acute, nonlocalized. Epigastric pain beginning last night. Nausea. EXAM: CT ABDOMEN AND PELVIS WITH CONTRAST TECHNIQUE: Multidetector CT imaging of the abdomen and pelvis was performed using the standard protocol following bolus administration of intravenous contrast. RADIATION DOSE REDUCTION: This exam was performed according to the departmental dose-optimization program which includes automated exposure control, adjustment of the mA and/or kV according to patient size and/or use of iterative reconstruction technique. CONTRAST:  37mL OMNIPAQUE IOHEXOL 300 MG/ML  SOLN COMPARISON:  Ultrasound same day FINDINGS: Lower chest: No active or  significant finding. Minimal chronic scarring at the left lung base. Hepatobiliary: Within the central portion of the right lobe of the liver, there is a 7 x 2 mm low-density area with some peripheral contrast puddling. Below that within the posterior segment of the right lobe there is a 2 cm low-density liver lesion with peripheral contrast puddling. At the very caudal tip of the right lobe of the liver there are 2 small foci measuring 7-8 mm in size of low-density. In correlation with the ultrasound, it is quite likely that all these lesions are benign hemangiomas. No calcified gallstones. No ductal dilatation. Pancreas: Normal Spleen: Normal Adrenals/Urinary Tract: Adrenal glands are normal. Kidneys are normal except for an insignificant 1 cm cyst on the left. Bladder is normal. Stomach/Bowel: Stomach is normal. Proximal small bowel is normal, possibly decompressed by vomiting.  There are numerous dilated loops of ileum with air-fluid levels and fecalized material, consistent with small bowel obstruction. Colon is normal. Appendix is normal. Vascular/Lymphatic: Aortic atherosclerosis. No aneurysm. IVC is normal. No adenopathy. 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 Other: Small amount ascites, freely distributed. Musculoskeletal: Lower lumbar degenerative disc disease and degenerative facet disease which could be symptomatic. IMPRESSION: Partial small bowel obstruction in the region of the ileum. Small amount of associated free fluid. Exact etiology not identified. Scattered liver lesions consistent with hemangiomas by ultrasound and CT. Aortic Atherosclerosis (ICD10-I70.0). Electronically Signed   By: Nelson Chimes M.D.   On: 01/18/2022 08:33   DG Abd Portable 1V-Small Bowel Obstruction Protocol-initial, 8 hr delay  Result Date: 01/18/2022 CLINICAL DATA:  8 hour small-bowel follow-up, initial encounter EXAM: PORTABLE ABDOMEN - 1 VIEW COMPARISON:  CT from earlier in the same  day. FINDINGS: Contrast material is noted within the stomach. Some passage of contrast into the proximal small bowel is noted. Scattered large and small bowel gas is noted. Some mildly dilated small bowel loops are again seen deep within the pelvis. Contrast is noted within the bladder from recent CT. No free air is seen. IMPRESSION: Contrast lies predominately within the stomach only minimal small bowel contrast is noted. 24 hour follow-up film is recommended. Electronically Signed   By: Inez Catalina M.D.   On: 01/18/2022 20:44   US ABDOMEN LIMITED RUQ (LIVER/GB)  Result Date: 01/18/2022 CLINICAL DATA:  Epigastric pain. EXAM: ULTRASOUND ABDOMEN LIMITED RIGHT UPPER QUADRANT COMPARISON:  None. FINDINGS: Gallbladder: No gallstones or wall thickening visualized. No sonographic Murphy sign noted by sonographer. Common bile duct: Diameter: 0.5 cm Liver: Well-circumscribed, homogeneously hyperechoic rounded hepatic masses, largest within the RIGHT hepatic lobe and measuring up to 2.4 cm. Within normal limits in parenchymal echogenicity. Portal vein is patent on color Doppler imaging with normal direction of blood flow towards the liver. Other: None. IMPRESSION: 1. No acute sonographic findings within the RIGHT upper quadrant. 2. Rounded hepatic masses, largest within the RIGHT hepatic lobe measuring 2.4 cm, are favored consistent with hepatic hemangiomas. Electronically Signed   By: Michaelle Birks M.D.   On: 01/18/2022 06:42     LOS: 1 day   Antonieta Pert, MD [facility]  01/19/2022, 9:00 AM

## 2022-01-19 NOTE — ED Notes (Signed)
Pt up to bathroom, meds given.

## 2022-01-19 NOTE — Progress Notes (Addendum)
Subjective:  CC: Alyssa Sosa is a 61 y.o. female  Hospital stay day 1,   SBO  HPI: Started passing flatus this am.  Pain still present but slightly improved.  No further episodes of nausea  ROS:  General: Denies weight loss, weight gain, fatigue, fevers, chills, and night sweats. Heart: Denies chest pain, palpitations, racing heart, irregular heartbeat, leg pain or swelling, and decreased activity tolerance. Respiratory: Denies breathing difficulty, shortness of breath, wheezing, cough, and sputum. GI: Denies change in appetite, heartburn, nausea, vomiting, constipation, diarrhea, and blood in stool. GU: Denies difficulty urinating, pain with urinating, urgency, frequency, blood in urine.   Objective:   Temp:  [98.8 F (37.1 C)-99 F (37.2 C)] 99 F (37.2 C) (01/31 1132) Pulse Rate:  [54-86] 73 (01/31 1132) Resp:  [12-26] 16 (01/31 1132) BP: (126-173)/(82-108) 137/91 (01/31 1132) SpO2:  [89 %-100 %] 95 % (01/31 1132)     Height: 5\' 2"  (157.5 cm) Weight: 54.4 kg BMI (Calculated): 21.94   Intake/Output this shift:  No intake or output data in the 24 hours ending 01/19/22 1442  Constitutional :  alert, cooperative, appears stated age, and no distress  Respiratory:  clear to auscultation bilaterally  Cardiovascular:  regular rate and rhythm  Gastrointestinal: Soft, no guarding still presents with suprapubic TTP .   Skin: Cool and moist.   Psychiatric: Normal affect, non-agitated, not confused       LABS:  CMP Latest Ref Rng & Units 01/19/2022 01/18/2022  Glucose 70 - 99 mg/dL 113(H) 138(H)  BUN 6 - 20 mg/dL 25(H) 23(H)  Creatinine 0.44 - 1.00 mg/dL 0.55 0.67  Sodium 135 - 145 mmol/L 140 139  Potassium 3.5 - 5.1 mmol/L 4.6 4.2  Chloride 98 - 111 mmol/L 104 103  CO2 22 - 32 mmol/L 29 26  Calcium 8.9 - 10.3 mg/dL 9.1 10.4(H)  Total Protein 6.5 - 8.1 g/dL - 7.2  Total Bilirubin 0.3 - 1.2 mg/dL - 0.4  Alkaline Phos 38 - 126 U/L - 79  AST 15 - 41 U/L - 19  ALT 0 - 44 U/L -  15   CBC Latest Ref Rng & Units 01/19/2022 01/18/2022  WBC 4.0 - 10.5 K/uL 10.9(H) 10.0  Hemoglobin 12.0 - 15.0 g/dL 13.8 15.0  Hematocrit 36.0 - 46.0 % 43.0 46.1(H)  Platelets 150 - 400 K/uL 288 331    RADS: Narrative & Impression  CLINICAL DATA:  8 hour small-bowel follow-up, initial encounter   EXAM: PORTABLE ABDOMEN - 1 VIEW   COMPARISON:  CT from earlier in the same day.   FINDINGS: Contrast material is noted within the stomach. Some passage of contrast into the proximal small bowel is noted. Scattered large and small bowel gas is noted. Some mildly dilated small bowel loops are again seen deep within the pelvis. Contrast is noted within the bladder from recent CT. No free air is seen.   IMPRESSION: Contrast lies predominately within the stomach only minimal small bowel contrast is noted. 24 hour follow-up film is recommended.     Electronically Signed   By: Inez Catalina M.D.   On: 01/18/2022 20:44   Assessment:   SBO, passing flatus but SBFT as noted above.  Will get repeat to see if any advancement of contrast prior to starting clears, since this still may represent pSBO.  Continue to monitor with serial abd exams.  labs/images/medications/previous chart entries reviewed personally and relevant changes/updates noted above.

## 2022-01-19 NOTE — ED Notes (Signed)
Report messaged to crystal rn floor nurse

## 2022-01-19 NOTE — Progress Notes (Signed)
Initial Nutrition Assessment  DOCUMENTATION CODES:  Not applicable  INTERVENTION:  Advance diet as medically able and as tolerated.  Add Boost Breeze po TID, each supplement provides 250 kcal and 9 grams of protein once diet advances to clear liquids.  Add MVI with minerals daily.  Encourage PO and supplement intake.  Obtain updated weight.  NUTRITION DIAGNOSIS:  Inadequate oral intake related to nausea, vomiting as evidenced by per patient/family report.  GOAL:  Patient will meet greater than or equal to 90% of their needs  MONITOR:  Diet advancement, PO intake, Supplement acceptance, Labs, Weight trends, I & O's  REASON FOR ASSESSMENT:  Malnutrition Screening Tool    ASSESSMENT:  61 yo female with a PMH of depression and tobacco use who presents with nausea, vomiting, and abdominal pain. Found to have scattered liver lesion and ultrasound showed hemangioma on the right side. Admitted with partial SBO.  Spoke with pt and husband at bedside. Pt reports that she had been eating well - fruit,  vegetables, carbs - before she started feeling ill. She endorsed a good appetite, but she does not have a good appetite at current.  She reports no recent changes with her weight.  Per Epic, pt's weight appears to be stated with limited weight history. RD to order measured weight to determine changes.  Discussed with patient regarding the standard protocol for diet advancement with a SBO. Pt and husband understanding.  Discussed starting CLD possibly today - RD to add Boost Breeze in preparation for this to occur.  Medications: reviewed; LR @ 100 ml/hr, Dilaudid per IV PRN (given once today), morphine per IV PRN (given twice today), Zofran per IV PRN (given once today)  Labs: reviewed; CBG 115 (H)  NUTRITION - FOCUSED PHYSICAL EXAM: Flowsheet Row Most Recent Value  Orbital Region No depletion  Upper Arm Region No depletion  Thoracic and Lumbar Region No depletion  Buccal Region  No depletion  Temple Region No depletion  Clavicle Bone Region Mild depletion  Clavicle and Acromion Bone Region No depletion  Scapular Bone Region No depletion  Dorsal Hand No depletion  Patellar Region No depletion  Anterior Thigh Region No depletion  Posterior Calf Region No depletion  Edema (RD Assessment) None  Hair Reviewed  Eyes Reviewed  Mouth Reviewed  Skin Reviewed  Nails Reviewed   Diet Order:   Diet Order             Diet NPO time specified Except for: Sips with Meds  Diet effective now                  EDUCATION NEEDS:  Education needs have been addressed  Skin:  Skin Assessment: Reviewed RN Assessment  Last BM:  01/17/22  Height:  Ht Readings from Last 1 Encounters:  01/18/22 5\' 2"  (1.575 m)   Weight:  Wt Readings from Last 1 Encounters:  01/18/22 54.4 kg   BMI:  Body mass index is 21.95 kg/m.  Estimated Nutritional Needs:  Kcal:  1700-1900 Protein:  70-85 grams Fluid:  >1.7 L  Derrel Nip, RD, LDN (she/her/hers) Clinical Inpatient Dietitian RD Pager/After-Hours/Weekend Pager # in [place]

## 2022-01-20 ENCOUNTER — Encounter: Payer: Self-pay | Admitting: Internal Medicine

## 2022-01-20 LAB — BASIC METABOLIC PANEL
Anion gap: 7 (ref 5–15)
BUN: 16 mg/dL (ref 6–20)
CO2: 29 mmol/L (ref 22–32)
Calcium: 8.6 mg/dL — ABNORMAL LOW (ref 8.9–10.3)
Chloride: 101 mmol/L (ref 98–111)
Creatinine, Ser: 0.54 mg/dL (ref 0.44–1.00)
GFR, Estimated: >60 mL/min (ref >60)
Glucose, Bld: 88 mg/dL (ref 70–99)
Potassium: 3.7 mmol/L (ref 3.5–5.1)
Sodium: 137 mmol/L (ref 135–145)

## 2022-01-20 LAB — GLUCOSE, CAPILLARY
Glucose-Capillary: 85 mg/dL (ref 70–99)
Glucose-Capillary: 92 mg/dL (ref 70–99)
Glucose-Capillary: 123 mg/dL — ABNORMAL HIGH (ref 70–99)

## 2022-01-20 LAB — MAGNESIUM: Magnesium: 2 mg/dL (ref 1.7–2.4)

## 2022-01-20 LAB — PHOSPHORUS: Phosphorus: 3.4 mg/dL (ref 2.5–4.6)

## 2022-01-20 NOTE — Plan of Care (Signed)
°  Problem: Education: Goal: Knowledge of General Education information will improve Description: Including pain rating scale, medication(s)/side effects and non-pharmacologic comfort measures Outcome: Progressing   Problem: Clinical Measurements: Goal: Will remain free from infection Outcome: Progressing   Problem: Clinical Measurements: Goal: Cardiovascular complication will be avoided Outcome: Progressing   Problem: Activity: Goal: Risk for activity intolerance will decrease Outcome: Progressing   Problem: Elimination: Goal: Will not experience complications related to bowel motility Outcome: Progressing   Problem: Pain Managment: Goal: General experience of comfort will improve Outcome: Progressing

## 2022-01-20 NOTE — Progress Notes (Signed)
Progress Note    SEENA RITACCO  GXQ:[id number] DOB: [birth year]  DOA: 01/18/2022 PCP: Newell Coral, PA-C      Brief Narrative:    Medical records reviewed and are as summarized below:  Alyssa Sosa is a 61 y.o. female with medical history significant for tobacco use disorder, depression, who presented to the hospital with nausea, vomiting and abdominal pain.  She was admitted to the hospital for partial small bowel obstruction.  She was managed conservatively with analgesics, IV fluids and antiemetics.  General surgery was consulted to assist with management.      Assessment/Plan:   Principal Problem:   Partial small bowel obstruction (HCC) Active Problems:   Depressive disorder   Liver hemangioma   Tobacco use   Hypercalcemia   Nutrition Problem: Inadequate oral intake Etiology: nausea, vomiting  Signs/Symptoms: per patient/family report   Body mass index is 21.95 kg/m.  Partial small bowel obstruction: Improving.  Diet has been advanced to full liquid diet.  Use analgesics as needed for pain.  Dr. Lysle Pearl, surgeon, recommended monitoring the patient overnight.  Hypercalcemia: Improved  Tobacco use disorder: Counseled to quit smoking  Right liver hemangioma: Outpatient follow-up recommended  Depression: Continue Cymbalta  Diet Order             Diet full liquid Room service appropriate? Yes; Fluid consistency: Thin  Diet effective now                      Consultants: General surgeon  Procedures: None    Medications:    DULoxetine  30 mg Oral Daily   enoxaparin (LOVENOX) injection  40 mg Subcutaneous Q24H   feeding supplement  1 Container Oral TID BM   multivitamin with minerals  1 tablet Oral Daily   nicotine  21 mg Transdermal Daily   Continuous Infusions:   Anti-infectives (From admission, onward)    None              Family Communication/Anticipated D/C date and plan/Code Status   DVT prophylaxis:  enoxaparin (LOVENOX) injection 40 mg Start: 01/19/22 2200 SCDs Start: 01/18/22 1031     Code Status: Full Code  Family Communication: None Disposition Plan: Plan to discharge home tomorrow   Status is: Inpatient Remains inpatient appropriate because: Partial SBO  Planned Discharge Destination: Home             Subjective:   Interval events noted.  She feels better today.  She is tolerating clear liquid diet.  Abdominal pain is improved.  She moved her bowels yesterday.  Objective:    Vitals:   01/19/22 2102 01/20/22 0459 01/20/22 0805 01/20/22 1204  BP: (!) 144/80 133/79 (!) 152/87 (!) 139/97  Pulse: 63 (!) 57 62 71  Resp: 18 18 16 18   Temp: 97.8 F (36.6 C) 97.6 F (36.4 C) 98.2 F (36.8 C) 98.2 F (36.8 C)  TempSrc: Oral Oral Oral Oral  SpO2: 98% 95% 98% 97%  Weight:      Height:       No data found.   Intake/Output Summary (Last 24 hours) at 01/20/2022 1210 Last data filed at 01/20/2022 0600 Gross per 24 hour  Intake 4247.26 ml  Output 3 ml  Net 4244.26 ml   Filed Weights   01/18/22 0511  Weight: 54.4 kg    Exam:  GEN: NAD SKIN: No rash EYES: EOMI ENT: MMM CV: RRR PULM: CTA B ABD: soft, ND, NT, +BS CNS: AAO  x 3, non focal EXT: No edema or tenderness        Data Reviewed:   I have personally reviewed following labs and imaging studies:  Labs: Labs show the following:   Basic Metabolic Panel: Recent Labs  Lab 01/18/22 0531 01/19/22 0520 01/20/22 0431  NA 139 140 137  K 4.2 4.6 3.7  CL 103 104 101  CO2 26 29 29   GLUCOSE 138* 113* 88  BUN 23* 25* 16  CREATININE 0.67 0.55 0.54  CALCIUM 10.4* 9.1 8.6*  MG  --  2.4 2.0  PHOS  --  3.5 3.4   GFR Estimated Creatinine Clearance: 59.9 mL/min (by C-G formula based on SCr of 0.54 mg/dL). Liver Function Tests: Recent Labs  Lab 01/18/22 0531  AST 19  ALT 15  ALKPHOS 79  BILITOT 0.4  PROT 7.2  ALBUMIN 4.1   Recent Labs  Lab 01/18/22 0531  LIPASE 27   No results  for input(s): AMMONIA in the last 168 hours. Coagulation profile Recent Labs  Lab 01/18/22 1102  INR 0.9    CBC: Recent Labs  Lab 01/18/22 0531 01/19/22 0520  WBC 10.0 10.9*  NEUTROABS  --  9.1*  HGB 15.0 13.8  HCT 46.1* 43.0  MCV 95.6 95.6  PLT 331 288   Cardiac Enzymes: No results for input(s): CKTOTAL, CKMB, CKMBINDEX, TROPONINI in the last 168 hours. BNP (last 3 results) No results for input(s): PROBNP in the last 8760 hours. CBG: Recent Labs  Lab 01/19/22 0839 01/19/22 1608 01/19/22 2104 01/20/22 0806  GLUCAP 115* 87 80 85   D-Dimer: No results for input(s): DDIMER in the last 72 hours. Hgb A1c: No results for input(s): HGBA1C in the last 72 hours. Lipid Profile: No results for input(s): CHOL, HDL, LDLCALC, TRIG, CHOLHDL, LDLDIRECT in the last 72 hours. Thyroid function studies: No results for input(s): TSH, T4TOTAL, T3FREE, THYROIDAB in the last 72 hours.  Invalid input(s): FREET3 Anemia work up: No results for input(s): VITAMINB12, FOLATE, FERRITIN, TIBC, IRON, RETICCTPCT in the last 72 hours. Sepsis Labs: Recent Labs  Lab 01/18/22 0531 01/19/22 0520  WBC 10.0 10.9*    Microbiology Recent Results (from the past 240 hour(s))  Resp Panel by RT-PCR (Flu A&B, Covid) Nasopharyngeal Swab     Status: None   Collection Time: 01/18/22  7:36 AM   Specimen: Nasopharyngeal Swab; Nasopharyngeal(NP) swabs in vial transport medium  Result Value Ref Range Status   SARS Coronavirus 2 by RT PCR NEGATIVE NEGATIVE Final    Comment: (NOTE) SARS-CoV-2 target nucleic acids are NOT DETECTED.  The SARS-CoV-2 RNA is generally detectable in upper respiratory specimens during the acute phase of infection. The lowest concentration of SARS-CoV-2 viral copies this assay can detect is 138 copies/mL. A negative result does not preclude SARS-Cov-2 infection and should not be used as the sole basis for treatment or other patient management decisions. A negative result may  occur with  improper specimen collection/handling, submission of specimen other than nasopharyngeal swab, presence of viral mutation(s) within the areas targeted by this assay, and inadequate number of viral copies(<138 copies/mL). A negative result must be combined with clinical observations, patient history, and epidemiological information. The expected result is Negative.  Fact Sheet for Patients:  EntrepreneurPulse.com.au  Fact Sheet for Healthcare Providers:  IncredibleEmployment.be  This test is no t yet approved or cleared by the [place] FDA and  has been authorized for detection and/or diagnosis of SARS-CoV-2 by FDA under an Emergency Use Authorization (EUA). This  EUA will remain  in effect (meaning this test can be used) for the duration of the COVID-19 declaration under Section 564(b)(1) of the Act, 21 U.S.C.section 360bbb-3(b)(1), unless the authorization is terminated  or revoked sooner.       Influenza A by PCR NEGATIVE NEGATIVE Final   Influenza B by PCR NEGATIVE NEGATIVE Final    Comment: (NOTE) The Xpert Xpress SARS-CoV-2/FLU/RSV plus assay is intended as an aid in the diagnosis of influenza from Nasopharyngeal swab specimens and should not be used as a sole basis for treatment. Nasal washings and aspirates are unacceptable for Xpert Xpress SARS-CoV-2/FLU/RSV testing.  Fact Sheet for Patients: EntrepreneurPulse.com.au  Fact Sheet for Healthcare Providers: IncredibleEmployment.be  This test is not yet approved or cleared by the [place] FDA and has been authorized for detection and/or diagnosis of SARS-CoV-2 by FDA under an Emergency Use Authorization (EUA). This EUA will remain in effect (meaning this test can be used) for the duration of the COVID-19 declaration under Section 564(b)(1) of the Act, 21 U.S.C. section 360bbb-3(b)(1), unless the authorization is terminated  or revoked.  Performed at [facility], [address] [place], [place] [address]     Procedures and diagnostic studies:  DG Abd 1 View  Result Date: 01/19/2022 CLINICAL DATA:  24 hour delay small-bowel follow-through EXAM: ABDOMEN - 1 VIEW COMPARISON:  Plain film from the previous day. FINDINGS: Previously administered contrast now lies completely within the colon consistent with a partial small bowel obstruction. The degree of small-bowel dilatation has improved in the interval from the prior exam. No free air is noted. No bony abnormality is noted. IMPRESSION: Minister contrast now lies within the colon consistent with a partial small bowel obstruction. The degree of small-bowel dilatation has improved in the interval from the prior study. Electronically Signed   By: Inez Catalina M.D.   On: 01/19/2022 21:26   DG Abd Portable 1V-Small Bowel Obstruction Protocol-initial, 8 hr delay  Result Date: 01/18/2022 CLINICAL DATA:  8 hour small-bowel follow-up, initial encounter EXAM: PORTABLE ABDOMEN - 1 VIEW COMPARISON:  CT from earlier in the same day. FINDINGS: Contrast material is noted within the stomach. Some passage of contrast into the proximal small bowel is noted. Scattered large and small bowel gas is noted. Some mildly dilated small bowel loops are again seen deep within the pelvis. Contrast is noted within the bladder from recent CT. No free air is seen. IMPRESSION: Contrast lies predominately within the stomach only minimal small bowel contrast is noted. 24 hour follow-up film is recommended. Electronically Signed   By: Inez Catalina M.D.   On: 01/18/2022 20:44               LOS: 2 days   Pheonix Clinkscale  [facility]   Pager on [email or web address]. If 7PM-7AM, please contact night-coverage at [email or web address]     01/20/2022, 12:10 PM

## 2022-01-21 LAB — BASIC METABOLIC PANEL
Anion gap: 7 (ref 5–15)
BUN: 19 mg/dL (ref 6–20)
CO2: 28 mmol/L (ref 22–32)
Calcium: 8.7 mg/dL — ABNORMAL LOW (ref 8.9–10.3)
Chloride: 105 mmol/L (ref 98–111)
Creatinine, Ser: 0.74 mg/dL (ref 0.44–1.00)
GFR, Estimated: >60 mL/min (ref >60)
Glucose, Bld: 102 mg/dL — ABNORMAL HIGH (ref 70–99)
Potassium: 4 mmol/L (ref 3.5–5.1)
Sodium: 140 mmol/L (ref 135–145)

## 2022-01-21 LAB — MAGNESIUM: Magnesium: 2.2 mg/dL (ref 1.7–2.4)

## 2022-01-21 LAB — GLUCOSE, CAPILLARY: Glucose-Capillary: 90 mg/dL (ref 70–99)

## 2022-01-21 LAB — PHOSPHORUS: Phosphorus: 4.7 mg/dL — ABNORMAL HIGH (ref 2.5–4.6)

## 2022-01-21 NOTE — Discharge Summary (Signed)
Physician Discharge Summary  Alyssa Sosa MWU:[id number] DOB: 08/10/1961 DOA: 01/18/2022  PCP: Newell Coral, PA-C  Admit date: 01/18/2022 Discharge date: 01/21/2022  Discharge disposition: Home   Recommendations for Outpatient Follow-Up:   Follow-up with PCP in 1 to 2 weeks Follow-up with Dr. Lysle Pearl, general surgeon, as needed   Discharge Diagnosis:   Principal Problem:   Partial small bowel obstruction ([place]) Active Problems:   Depressive disorder   Liver hemangioma   Tobacco use   Hypercalcemia    Discharge Condition: Stable.  Diet recommendation:  Diet Order             Diet - low sodium heart healthy                     Code Status: Prior     Hospital Course:   Alyssa Sosa is a 61 y.o. female with medical history significant for tobacco use disorder, depression, who presented to the hospital with nausea, vomiting and abdominal pain.  She was admitted to the hospital for partial small bowel obstruction.  She was managed conservatively with analgesics, IV fluids and antiemetics. General surgery was consulted to assist with management.  Her condition slowly improved.  Abdominal pain and vomiting have resolved.  She was able to tolerate a regular diet and she is deemed stable for discharge to home today.  Discharge plan was discussed with the patient and her husband at the bedside.            Medical Consultants:   General surgeon   Discharge Exam:    Vitals:   01/20/22 2032 01/21/22 0015 01/21/22 0426 01/21/22 0727  BP: 112/79 107/69 133/87 130/88  Pulse: 65 60 (!) 55 (!) 58  Resp: 19 17 14 18   Temp: 98.4 F (36.9 C) 97.9 F (36.6 C) 97.6 F (36.4 C) 98.4 F (36.9 C)  TempSrc: Oral  Oral Oral  SpO2: 96% 96% 97% 96%  Weight:      Height:         GEN: NAD SKIN: No rash EYES: EOMI ENT: MMM CV: RRR PULM: CTA B ABD: soft, ND, NT, +BS CNS: AAO x 3, non focal EXT: No edema or tenderness   The results of significant  diagnostics from this hospitalization (including imaging, microbiology, ancillary and laboratory) are listed below for reference.     Procedures and Diagnostic Studies:   DG Abd 1 View  Result Date: 01/19/2022 CLINICAL DATA:  24 hour delay small-bowel follow-through EXAM: ABDOMEN - 1 VIEW COMPARISON:  Plain film from the previous day. FINDINGS: Previously administered contrast now lies completely within the colon consistent with a partial small bowel obstruction. The degree of small-bowel dilatation has improved in the interval from the prior exam. No free air is noted. No bony abnormality is noted. IMPRESSION: Minister contrast now lies within the colon consistent with a partial small bowel obstruction. The degree of small-bowel dilatation has improved in the interval from the prior study. Electronically Signed   By: Inez Catalina M.D.   On: 01/19/2022 21:26   CT ABDOMEN PELVIS W CONTRAST  Result Date: 01/18/2022 CLINICAL DATA:  Abdominal pain, acute, nonlocalized. Epigastric pain beginning last night. Nausea. EXAM: CT ABDOMEN AND PELVIS WITH CONTRAST TECHNIQUE: Multidetector CT imaging of the abdomen and pelvis was performed using the standard protocol following bolus administration of intravenous contrast. RADIATION DOSE REDUCTION: This exam was performed according to the departmental dose-optimization program which includes automated exposure control, adjustment of the  mA and/or kV according to patient size and/or use of iterative reconstruction technique. CONTRAST:  68mL OMNIPAQUE IOHEXOL 300 MG/ML  SOLN COMPARISON:  Ultrasound same day FINDINGS: Lower chest: No active or significant finding. Minimal chronic scarring at the left lung base. Hepatobiliary: Within the central portion of the right lobe of the liver, there is a 7 x 2 mm low-density area with some peripheral contrast puddling. Below that within the posterior segment of the right lobe there is a 2 cm low-density liver lesion with peripheral  contrast puddling. At the very caudal tip of the right lobe of the liver there are 2 small foci measuring 7-8 mm in size of low-density. In correlation with the ultrasound, it is quite likely that all these lesions are benign hemangiomas. No calcified gallstones. No ductal dilatation. Pancreas: Normal Spleen: Normal Adrenals/Urinary Tract: Adrenal glands are normal. Kidneys are normal except for an insignificant 1 cm cyst on the left. Bladder is normal. Stomach/Bowel: Stomach is normal. Proximal small bowel is normal, possibly decompressed by vomiting. There are numerous dilated loops of ileum with air-fluid levels and fecalized material, consistent with small bowel obstruction. Colon is normal. Appendix is normal. Vascular/Lymphatic: Aortic atherosclerosis. No aneurysm. IVC is normal. No adenopathy. 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 Other: Small amount ascites, freely distributed. Musculoskeletal: Lower lumbar degenerative disc disease and degenerative facet disease which could be symptomatic. IMPRESSION: Partial small bowel obstruction in the region of the ileum. Small amount of associated free fluid. Exact etiology not identified. Scattered liver lesions consistent with hemangiomas by ultrasound and CT. Aortic Atherosclerosis (ICD10-I70.0). Electronically Signed   By: Nelson Chimes M.D.   On: 01/18/2022 08:33   DG Abd Portable 1V-Small Bowel Obstruction Protocol-initial, 8 hr delay  Result Date: 01/18/2022 CLINICAL DATA:  8 hour small-bowel follow-up, initial encounter EXAM: PORTABLE ABDOMEN - 1 VIEW COMPARISON:  CT from earlier in the same day. FINDINGS: Contrast material is noted within the stomach. Some passage of contrast into the proximal small bowel is noted. Scattered large and small bowel gas is noted. Some mildly dilated small bowel loops are again seen deep within the pelvis. Contrast is noted within the bladder from recent CT. No free air is seen. IMPRESSION:  Contrast lies predominately within the stomach only minimal small bowel contrast is noted. 24 hour follow-up film is recommended. Electronically Signed   By: Inez Catalina M.D.   On: 01/18/2022 20:44   US ABDOMEN LIMITED RUQ (LIVER/GB)  Result Date: 01/18/2022 CLINICAL DATA:  Epigastric pain. EXAM: ULTRASOUND ABDOMEN LIMITED RIGHT UPPER QUADRANT COMPARISON:  None. FINDINGS: Gallbladder: No gallstones or wall thickening visualized. No sonographic Murphy sign noted by sonographer. Common bile duct: Diameter: 0.5 cm Liver: Well-circumscribed, homogeneously hyperechoic rounded hepatic masses, largest within the RIGHT hepatic lobe and measuring up to 2.4 cm. Within normal limits in parenchymal echogenicity. Portal vein is patent on color Doppler imaging with normal direction of blood flow towards the liver. Other: None. IMPRESSION: 1. No acute sonographic findings within the RIGHT upper quadrant. 2. Rounded hepatic masses, largest within the RIGHT hepatic lobe measuring 2.4 cm, are favored consistent with hepatic hemangiomas. Electronically Signed   By: Michaelle Birks M.D.   On: 01/18/2022 06:42     Labs:   Basic Metabolic Panel: Recent Labs  Lab 01/18/22 0531 01/19/22 0520 01/20/22 0431 01/21/22 0447  NA 139 140 137 140  K 4.2 4.6 3.7 4.0  CL 103 104 101 105  CO2 26 29 29 28   GLUCOSE 138*  113* 88 102*  BUN 23* 25* 16 19  CREATININE 0.67 0.55 0.54 0.74  CALCIUM 10.4* 9.1 8.6* 8.7*  MG  --  2.4 2.0 2.2  PHOS  --  3.5 3.4 4.7*   GFR Estimated Creatinine Clearance: 59.9 mL/min (by C-G formula based on SCr of 0.74 mg/dL). Liver Function Tests: Recent Labs  Lab 01/18/22 0531  AST 19  ALT 15  ALKPHOS 79  BILITOT 0.4  PROT 7.2  ALBUMIN 4.1   Recent Labs  Lab 01/18/22 0531  LIPASE 27   No results for input(s): AMMONIA in the last 168 hours. Coagulation profile Recent Labs  Lab 01/18/22 1102  INR 0.9    CBC: Recent Labs  Lab 01/18/22 0531 01/19/22 0520  WBC 10.0 10.9*   NEUTROABS  --  9.1*  HGB 15.0 13.8  HCT 46.1* 43.0  MCV 95.6 95.6  PLT 331 288   Cardiac Enzymes: No results for input(s): CKTOTAL, CKMB, CKMBINDEX, TROPONINI in the last 168 hours. BNP: Invalid input(s): POCBNP CBG: Recent Labs  Lab 01/19/22 2104 01/20/22 0806 01/20/22 1641 01/20/22 1934 01/21/22 0729  GLUCAP 80 85 92 123* 90   D-Dimer No results for input(s): DDIMER in the last 72 hours. Hgb A1c No results for input(s): HGBA1C in the last 72 hours. Lipid Profile No results for input(s): CHOL, HDL, LDLCALC, TRIG, CHOLHDL, LDLDIRECT in the last 72 hours. Thyroid function studies No results for input(s): TSH, T4TOTAL, T3FREE, THYROIDAB in the last 72 hours.  Invalid input(s): FREET3 Anemia work up No results for input(s): VITAMINB12, FOLATE, FERRITIN, TIBC, IRON, RETICCTPCT in the last 72 hours. Microbiology Recent Results (from the past 240 hour(s))  Resp Panel by RT-PCR (Flu A&B, Covid) Nasopharyngeal Swab     Status: None   Collection Time: 01/18/22  7:36 AM   Specimen: Nasopharyngeal Swab; Nasopharyngeal(NP) swabs in vial transport medium  Result Value Ref Range Status   SARS Coronavirus 2 by RT PCR NEGATIVE NEGATIVE Final    Comment: (NOTE) SARS-CoV-2 target nucleic acids are NOT DETECTED.  The SARS-CoV-2 RNA is generally detectable in upper respiratory specimens during the acute phase of infection. The lowest concentration of SARS-CoV-2 viral copies this assay can detect is 138 copies/mL. A negative result does not preclude SARS-Cov-2 infection and should not be used as the sole basis for treatment or other patient management decisions. A negative result may occur with  improper specimen collection/handling, submission of specimen other than nasopharyngeal swab, presence of viral mutation(s) within the areas targeted by this assay, and inadequate number of viral copies(<138 copies/mL). A negative result must be combined with clinical observations, patient  history, and epidemiological information. The expected result is Negative.  Fact Sheet for Patients:  EntrepreneurPulse.com.au  Fact Sheet for Healthcare Providers:  IncredibleEmployment.be  This test is no t yet approved or cleared by the [place] FDA and  has been authorized for detection and/or diagnosis of SARS-CoV-2 by FDA under an Emergency Use Authorization (EUA). This EUA will remain  in effect (meaning this test can be used) for the duration of the COVID-19 declaration under Section 564(b)(1) of the Act, 21 U.S.C.section 360bbb-3(b)(1), unless the authorization is terminated  or revoked sooner.       Influenza A by PCR NEGATIVE NEGATIVE Final   Influenza B by PCR NEGATIVE NEGATIVE Final    Comment: (NOTE) The Xpert Xpress SARS-CoV-2/FLU/RSV plus assay is intended as an aid in the diagnosis of influenza from Nasopharyngeal swab specimens and should not be used as a  sole basis for treatment. Nasal washings and aspirates are unacceptable for Xpert Xpress SARS-CoV-2/FLU/RSV testing.  Fact Sheet for Patients: EntrepreneurPulse.com.au  Fact Sheet for Healthcare Providers: IncredibleEmployment.be  This test is not yet approved or cleared by the [place] FDA and has been authorized for detection and/or diagnosis of SARS-CoV-2 by FDA under an Emergency Use Authorization (EUA). This EUA will remain in effect (meaning this test can be used) for the duration of the COVID-19 declaration under Section 564(b)(1) of the Act, 21 U.S.C. section 360bbb-3(b)(1), unless the authorization is terminated or revoked.  Performed at [facility], [address], [place], [place] [address]      Discharge Instructions:   Discharge Instructions     Diet - low sodium heart healthy   Complete by: As directed    Increase activity slowly   Complete by: As directed       Allergies as of 01/21/2022        Reactions   Tyloxapol Nausea And Vomiting        Medication List     STOP taking these medications    ergocalciferol 1.25 MG (50000 UT) capsule Commonly known as: VITAMIN D2   Premarin vaginal cream Generic drug: conjugated estrogens       TAKE these medications    DULoxetine 30 MG capsule Commonly known as: CYMBALTA Take 30 mg by mouth daily.        Follow-up Information     Lin Landsman, MD Follow up on 04/19/2022.   Specialty: Gastroenterology Why: @315pm  Contact information: [place] [address] [phone]         Benjamine Sprague, DO. Go to.   Specialty: Surgery Why: As needed Contact information: [address] [place] [place] [address] [phone]                   If you experience worsening of your admission symptoms, develop shortness of breath, life threatening emergency, suicidal or homicidal thoughts you must seek medical attention immediately by calling 911 or calling your MD immediately  if symptoms less severe.   You must read complete instructions/literature along with all the possible adverse reactions/side effects for all the medicines you take and that have been prescribed to you. Take any new medicines after you have completely understood and accept all the possible adverse reactions/side effects.    Please note   You were cared for by a hospitalist during your hospital stay. If you have any questions about your discharge medications or the care you received while you were in the hospital after you are discharged, you can call the unit and asked to speak with the hospitalist on call if the hospitalist that took care of you is not available. Once you are discharged, your primary care physician will handle any further medical issues. Please note that NO REFILLS for any discharge medications will be authorized once you are discharged, as it is imperative that you return to your primary care physician  (or establish a relationship with a primary care physician if you do not have one) for your aftercare needs so that they can reassess your need for medications and monitor your lab values.       Time coordinating discharge: Greater than 30 minutes  Signed:  Katty Fretwell  [facility] 01/21/2022, 5:10 PM   Pager on [email or web address]. If 7PM-7AM, please contact night-coverage at [email or web address]

## 2022-01-21 NOTE — Progress Notes (Signed)
Subjective:  CC: Alyssa Sosa is a 61 y.o. female  Hospital stay day 2,   SBO  HPI: No acute issues.  Passing BM, pain resolved.  ROS:  General: Denies weight loss, weight gain, fatigue, fevers, chills, and night sweats. Heart: Denies chest pain, palpitations, racing heart, irregular heartbeat, leg pain or swelling, and decreased activity tolerance. Respiratory: Denies breathing difficulty, shortness of breath, wheezing, cough, and sputum. GI: Denies change in appetite, heartburn, nausea, vomiting, constipation, diarrhea, and blood in stool. GU: Denies difficulty urinating, pain with urinating, urgency, frequency, blood in urine.   Objective:   VSS     Height: 5\' 2"  (157.5 cm) Weight: 54.4 kg BMI (Calculated): 21.94     Constitutional :  alert, cooperative, appears stated age, and no distress  Respiratory:  clear to auscultation bilaterally  Cardiovascular:  regular rate and rhythm  Gastrointestinal: soft, non-tender; bowel sounds normal; no masses,  no organomegaly.   Skin: Cool and moist.   Psychiatric: Normal affect, non-agitated, not confused       LABS:   WNL  RADS: CLINICAL DATA:  24 hour delay small-bowel follow-through   EXAM: ABDOMEN - 1 VIEW   COMPARISON:  Plain film from the previous day.   FINDINGS: Previously administered contrast now lies completely within the colon consistent with a partial small bowel obstruction. The degree of small-bowel dilatation has improved in the interval from the prior exam. No free air is noted. No bony abnormality is noted.   IMPRESSION: Minister contrast now lies within the colon consistent with a partial small bowel obstruction. The degree of small-bowel dilatation has improved in the interval from the prior study.     Electronically Signed   By: Inez Catalina M.D.   On: 01/19/2022 21:26 Assessment:   SBO. Resolving.  ADAT.  Discussed case with GI and they recommended outpt f/u for possible cause of this SBO.   F/u appt made.  labs/images/medications/previous chart entries reviewed personally and relevant changes/updates noted above.

## 2022-01-21 NOTE — Progress Notes (Signed)
Subjective:  CC: Alyssa Sosa is a 61 y.o. female  Hospital stay day 3,   SBO  HPI: No issues overnight.  Passing flatus and additional BM, tolerating reg diet.  ROS:  General: Denies weight loss, weight gain, fatigue, fevers, chills, and night sweats. Heart: Denies chest pain, palpitations, racing heart, irregular heartbeat, leg pain or swelling, and decreased activity tolerance. Respiratory: Denies breathing difficulty, shortness of breath, wheezing, cough, and sputum. GI: Denies change in appetite, heartburn, nausea, vomiting, constipation, diarrhea, and blood in stool. GU: Denies difficulty urinating, pain with urinating, urgency, frequency, blood in urine.   Objective:   Temp:  [97.6 F (36.4 C)-98.8 F (37.1 C)] 98.4 F (36.9 C) (02/02 0727) Pulse Rate:  [55-71] 58 (02/02 0727) Resp:  [14-19] 18 (02/02 0727) BP: (107-139)/(69-97) 130/88 (02/02 0727) SpO2:  [96 %-97 %] 96 % (02/02 0727)     Height: 5\' 2"  (157.5 cm) Weight: 54.4 kg BMI (Calculated): 21.94   Intake/Output this shift:   Intake/Output Summary (Last 24 hours) at 01/21/2022 0941 Last data filed at 01/20/2022 2116 Gross per 24 hour  Intake 474 ml  Output --  Net 474 ml    Constitutional :  alert, cooperative, appears stated age, and no distress  Respiratory:  clear to auscultation bilaterally  Cardiovascular:  regular rate and rhythm  Gastrointestinal: Soft, no guarding, TTP resolved.   Skin: Cool and moist.   Psychiatric: Normal affect, non-agitated, not confused       LABS:  CMP Latest Ref Rng & Units 01/21/2022 01/20/2022 01/19/2022  Glucose 70 - 99 mg/dL 102(H) 88 113(H)  BUN 6 - 20 mg/dL 19 16 25(H)  Creatinine 0.44 - 1.00 mg/dL 0.74 0.54 0.55  Sodium 135 - 145 mmol/L 140 137 140  Potassium 3.5 - 5.1 mmol/L 4.0 3.7 4.6  Chloride 98 - 111 mmol/L 105 101 104  CO2 22 - 32 mmol/L 28 29 29   Calcium 8.9 - 10.3 mg/dL 8.7(L) 8.6(L) 9.1  Total Protein 6.5 - 8.1 g/dL - - -  Total Bilirubin 0.3 - 1.2 mg/dL -  - -  Alkaline Phos 38 - 126 U/L - - -  AST 15 - 41 U/L - - -  ALT 0 - 44 U/L - - -   CBC Latest Ref Rng & Units 01/19/2022 01/18/2022  WBC 4.0 - 10.5 K/uL 10.9(H) 10.0  Hemoglobin 12.0 - 15.0 g/dL 13.8 15.0  Hematocrit 36.0 - 46.0 % 43.0 46.1(H)  Platelets 150 - 400 K/uL 288 331    RADS: N/a  Assessment:   SBO, resolving.  Ok to d/c from surgery standoint.  F/u prn.  GI f/u arranged

## 2022-01-21 NOTE — Progress Notes (Signed)
Latash A Schoeppner to be D/C'd Home per MD order.  Discussed prescriptions and follow up appointments with the patient. Prescriptions given to patient, medication list explained in detail. Pt verbalized understanding.  Allergies as of 01/21/2022       Reactions   Tyloxapol Nausea And Vomiting        Medication List     STOP taking these medications    ergocalciferol 1.25 MG (50000 UT) capsule Commonly known as: VITAMIN D2   Premarin vaginal cream Generic drug: conjugated estrogens       TAKE these medications    DULoxetine 30 MG capsule Commonly known as: CYMBALTA Take 30 mg by mouth daily.        Vitals:   01/21/22 0426 01/21/22 0727  BP: 133/87 130/88  Pulse: (!) 55 (!) 58  Resp: 14 18  Temp: 97.6 F (36.4 C) 98.4 F (36.9 C)  SpO2: 97% 96%    Skin clean, dry and intact without evidence of skin break down, no evidence of skin tears noted. IV catheter discontinued intact. Site without signs and symptoms of complications. Dressing and pressure applied. Pt denies pain at this time. No complaints noted.  An After Visit Summary was printed and given to the patient. Patient escorted via [place], and D/C home via private auto.  Rolley Sims

## 2022-04-19 ENCOUNTER — Ambulatory Visit: Payer: BC Managed Care – PPO | Admitting: Gastroenterology

## 2022-04-19 ENCOUNTER — Encounter: Payer: Self-pay | Admitting: Gastroenterology

## 2022-04-19 VITALS — BP 134/73 | HR 57 | Temp 98.0°F | Ht 63.0 in | Wt 130.0 lb

## 2022-04-19 DIAGNOSIS — K56600 Partial intestinal obstruction, unspecified as to cause: Secondary | ICD-10-CM

## 2022-04-19 NOTE — Progress Notes (Signed)
?  ?Cephas Darby, MD ?[address]  ?[address]  ?[place], [place] [address]  ?Main: [phone]  ?Fax: [phone] ? ? ? ?Gastroenterology Consultation ? ?Referring Provider:     Newell Coral, PA-C ?Primary Care Physician:  Newell Coral, PA-C ?Primary Gastroenterologist:  Dr. Cephas Darby ?Reason for Consultation:     History of partial small bowel obstruction ?      ? HPI:   ?Alyssa Sosa is a 61 y.o. female referred by Dr. Newell Coral, PA-C  for consultation & management of history of partial small bowel obstruction.  Patient was admitted to [facility] on 01/18/2022 secondary to central abdominal pain, nausea, vomiting, CT revealed partial small bowel obstruction at the level of ileum.  Patient was evaluated by general surgery and her symptoms resolved with conservative management only.  Patient is here to discuss about further management of partial SBO.  She denies any abdominal surgeries.  She underwent colonoscopy in 02/2021 which was unremarkable.  At that time, terminal ileum was not examined ? ?Patient is currently asymptomatic, she is a vegetarian, denies any recurrence of symptoms, reports having regular bowel movements, denies any abdominal bloating.  She does smoke tobacco ? ?NSAIDs: None ? ?Antiplts/Anticoagulants/Anti thrombotics: None ? ?GI Procedures:  ?Colonoscopy 03/04/2021 ?- Perianal skin tags found on perianal exam. ?- Two 4 to 5 mm polyps in the ascending colon, removed with a cold snare. Resected and retrieved. ?- The distal rectum and anal verge are normal on retroflexion view. ?- The examination was otherwise normal. ?DIAGNOSIS:  ?A.  COLON POLYPS X2, ASCENDING; COLD SNARE:  ?- TUBULAR ADENOMA (MULTIPLE FRAGMENTS).  ?- NEGATIVE FOR HIGH-GRADE DYSPLASIA AND MALIGNANCY. ? ? ?Past Medical History:  ?Diagnosis Date  ? Depression   ? ? ?Past Surgical History:  ?Procedure Laterality Date  ? 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 AUGMENTATION MAMMAPLASTY Bilateral 2002  ? silicone  ?  COLONOSCOPY WITH PROPOFOL N/A 03/04/2021  ? Procedure: COLONOSCOPY WITH PROPOFOL;  Surgeon: Lin Landsman, MD;  Location: [facility] ENDOSCOPY;  Service: Endoscopy;  Laterality: N/A;  ? TMJ ARTHROPLASTY    ? ?Current Outpatient Medications:  ?  DULoxetine (CYMBALTA) 30 MG capsule, Take 30 mg by mouth daily., Disp: , Rfl:  ? ? ?Family History  ?Problem Relation Age of Onset  ? Colon cancer Father 13  ? Melanoma Sister 31  ?  ? ?Social History  ? ?Tobacco Use  ? Smoking status: Every Day  ?  Types: Cigarettes  ? Smokeless tobacco: Never  ?Vaping Use  ? Vaping Use: Some days  ?Substance Use Topics  ? Alcohol use: No  ? Drug use: No  ? ? ?Allergies as of 04/19/2022 - Review Complete 04/19/2022  ?Allergen Reaction Noted  ? Tyloxapol Nausea And Vomiting 01/18/2022  ? ? ?Review of Systems:    ?All systems reviewed and negative except where noted in HPI. ? ? Physical Exam:  ?BP 134/73 (BP Location: Left Arm, Patient Position: Sitting, Cuff Size: Normal)   Pulse (!) 57   Temp 98 ?F (36.7 ?C) (Oral)   Ht '5\' 3"'$  (1.6 m)   Wt 130 lb (59 kg)   BMI 23.03 kg/m?  ?No LMP recorded. Patient is postmenopausal. ? ?General:   Alert,  Well-developed, well-nourished, pleasant and cooperative in NAD ?Head:  Normocephalic and atraumatic. ?Eyes:  Sclera clear, no icterus.   Conjunctiva pink. ?Ears:  Normal auditory acuity. ?Nose:  No deformity, discharge, or lesions. ?Mouth:  No deformity or  lesions,oropharynx pink & moist. ?Neck:  Supple; no masses or thyromegaly. ?Lungs:  Respirations even and unlabored.  Clear throughout to auscultation.   No wheezes, crackles, or rhonchi. No acute distress. ?Heart:  Regular rate and rhythm; no murmurs, clicks, rubs, or gallops. ?Abdomen:  Normal bowel sounds. Soft, non-tender and non-distended without masses, hepatosplenomegaly or hernias noted.  No guarding or rebound tenderness.   ?Rectal: Not performed ?Msk:  Symmetrical without gross deformities. Good, equal movement & strength  bilaterally. ?Pulses:  Normal pulses noted. ?Extremities:  No clubbing or edema.  No cyanosis. ?Neurologic:  Alert and oriented x3;  grossly normal neurologically. ?Skin:  Intact without significant lesions or rashes. No jaundice. ?Psych:  Alert and cooperative. Normal mood and affect. ? ?Imaging Studies: ?Reviewed ? ?Assessment and Plan:  ? ?Alyssa Sosa is a 61 y.o. pleasant Caucasian female with no significant past medical history, history of depression is seen in consultation for an isolated episode of partial SBO and CT did not reveal any structural lesions at the time of obstruction.  Patient is currently asymptomatic ?Recommend CT enterography ? ? ?Follow up as needed based on the CT enterography results ? ? ?Cephas Darby, MD ? ?

## 2022-04-19 NOTE — Patient Instructions (Addendum)
Your CT scan is schedule for 04/27/2022 at 9:00am. Please arrived to medical mall at 8:45am. Nothing to eat or drink 4 hours before the scan.  ?Please pick oral contrast before the scan at the medical mall.  ?

## 2022-04-27 ENCOUNTER — Ambulatory Visit
Admission: RE | Admit: 2022-04-27 | Discharge: 2022-04-27 | Disposition: A | Discharge status: Home / Self Care | Payer: BC Managed Care – PPO | Source: Ambulatory Visit | Attending: Gastroenterology | Admitting: Gastroenterology

## 2022-04-27 DIAGNOSIS — K56600 Partial intestinal obstruction, unspecified as to cause: Secondary | ICD-10-CM | POA: Diagnosis present

## 2022-04-27 MED ORDER — IOHEXOL 300 MG/ML  SOLN
100.0000 mL | Freq: Once | INTRAMUSCULAR | Status: AC | PRN
  Administered 2022-04-27: 100 mL via INTRAVENOUS

## 2022-04-28 ENCOUNTER — Telehealth: Payer: Self-pay

## 2022-04-28 NOTE — Telephone Encounter (Signed)
Called and left a message for call back. Fax results to PCP  ?

## 2022-04-28 NOTE — Telephone Encounter (Signed)
-----   Message from Lin Landsman, MD sent at 04/28/2022  2:01 PM EDT ----- ?Caryl Pina ? ?Please inform patient that the CT enterography results shows she has moderate stool burden and recommended to take fiber supplements as well as stool softener to keep bowels regular ? ?Also, incidentally, she was found to have 1.3 cm lesion in her left kidney which needs MRI renal mass protocol per the radiologist.  Please fax CT scan results to patient's PCP as urgent as well as inform patient to follow-up with her PCP regarding MRI.  If she does not hear back from her PCP in next 2 to 3 weeks regarding MRI, patient should let us know ? ?Thanks ?Rohini Vanga ?

## 2022-04-29 NOTE — Telephone Encounter (Signed)
Patient verbalized understanding of results. She states she will call her PCP today to get them to order the MRI  ?

## 2022-09-22 ENCOUNTER — Other Ambulatory Visit: Payer: Self-pay | Admitting: Physician Assistant

## 2022-09-22 DIAGNOSIS — Z1231 Encounter for screening mammogram for malignant neoplasm of breast: Secondary | ICD-10-CM

## 2022-09-30 ENCOUNTER — Ambulatory Visit (INDEPENDENT_AMBULATORY_CARE_PROVIDER_SITE_OTHER): Payer: BC Managed Care – PPO | Admitting: Obstetrics and Gynecology

## 2022-09-30 ENCOUNTER — Encounter: Payer: Self-pay | Admitting: Obstetrics and Gynecology

## 2022-09-30 VITALS — BP 139/89 | HR 59 | Ht 63.0 in | Wt 125.4 lb

## 2022-09-30 DIAGNOSIS — Z01419 Encounter for gynecological examination (general) (routine) without abnormal findings: Secondary | ICD-10-CM | POA: Diagnosis not present

## 2022-09-30 NOTE — Progress Notes (Signed)
HPI:      [name] is a 61 y.o. G0P0000 who LMP was No LMP recorded. Patient is postmenopausal.  Subjective:   She presents today for her annual examination.  She has no complaints.  She says she is doing well.    Hx: The following portions of the patient's history were reviewed and updated as appropriate:             She  has a past medical history of Depression. She does not have any pertinent problems on file. She  has a past surgical history that includes TMJ Arthroplasty; Amputation finger (Right); Augmentation mammaplasty (Bilateral, 2002); and Colonoscopy with propofol (N/A, 03/04/2021). Her family history includes Colon cancer (age of onset: 68) in her father; Melanoma (age of onset: 24) in her sister. She  reports that she has been smoking cigarettes. She has never used smokeless tobacco. She reports that she does not drink alcohol and does not use drugs. She has a current medication list which includes the following prescription(s): duloxetine. She is allergic to tyloxapol.       Review of Systems:  Review of Systems  Constitutional: Denied constitutional symptoms, night sweats, recent illness, fatigue, fever, insomnia and weight loss.  Eyes: Denied eye symptoms, eye pain, photophobia, vision change and visual disturbance.  Ears/Nose/Throat/Neck: Denied ear, nose, throat or neck symptoms, hearing loss, nasal discharge, sinus congestion and sore throat.  Cardiovascular: Denied cardiovascular symptoms, arrhythmia, chest pain/pressure, edema, exercise intolerance, orthopnea and palpitations.  Respiratory: Denied pulmonary symptoms, asthma, pleuritic pain, productive sputum, cough, dyspnea and wheezing.  Gastrointestinal: Denied, gastro-esophageal reflux, melena, nausea and vomiting.  Genitourinary: Denied genitourinary symptoms including symptomatic vaginal discharge, pelvic relaxation issues, and urinary complaints.  Musculoskeletal: Denied musculoskeletal symptoms,  stiffness, swelling, muscle weakness and myalgia.  Dermatologic: Denied dermatology symptoms, rash and scar.  Neurologic: Denied neurology symptoms, dizziness, headache, neck pain and syncope.  Psychiatric: Denied psychiatric symptoms, anxiety and depression.  Endocrine: Denied endocrine symptoms including hot flashes and night sweats.   Meds:   Current Outpatient Medications on File Prior to Visit  Medication Sig Dispense Refill   DULoxetine (CYMBALTA) 30 MG capsule Take 30 mg by mouth daily.     No current facility-administered medications on file prior to visit.     Objective:     Vitals:   09/30/22 1118  BP: 139/89  Pulse: (!) 59    Filed Weights   09/30/22 1118  Weight: 125 lb 6.4 oz (56.9 kg)              Physical examination General NAD, Conversant  HEENT Atraumatic; Op clear with mmm.  Normo-cephalic. Pupils reactive. Anicteric sclerae  Thyroid/Neck Smooth without nodularity or enlargement. Normal ROM.  Neck Supple.  Skin No rashes, lesions or ulceration. Normal palpated skin turgor. No nodularity.  Breasts: No masses or discharge.  Symmetric.  No axillary adenopathy.  Lungs: Clear to auscultation.No rales or wheezes. Normal Respiratory effort, no retractions.  Heart: NSR.  No murmurs or rubs appreciated. No periferal edema  Abdomen: Soft.  Non-tender.  No masses.  No HSM. No hernia  Extremities: Moves all appropriately.  Normal ROM for age. No lymphadenopathy.  Neuro: Oriented to PPT.  Normal mood. Normal affect.     Pelvic:   Vulva: Normal appearance.  No lesions.  Vagina: No lesions or abnormalities noted.  Support: Normal pelvic support.  Urethra No masses tenderness or scarring.  Meatus Normal size without lesions or prolapse.  Cervix: Normal appearance.  No lesions.  Anus: Normal exam.  No lesions.  Perineum: Normal exam.  No lesions.        Bimanual   Uterus: Normal size.  Non-tender.  Mobile.  AV.  Adnexae: No masses.  Non-tender to palpation.   Cul-de-sac: Negative for abnormality.     Assessment:    G0P0000 Patient Active Problem List   Diagnosis Date Noted   Partial small bowel obstruction ([place]) 01/18/2022   Liver hemangioma 01/18/2022   Tobacco use 01/18/2022   Hypercalcemia 01/18/2022   History of colonic polyps    Colon polyps 02/24/2021   Depressive disorder 10/19/2016   Sleep disorder 07/18/2014   Memory loss 05/10/2014     1. Well woman exam with routine gynecological exam     No problems   Plan:            1.  Basic Screening Recommendations The basic screening recommendations for asymptomatic women were discussed with the patient during her visit.  The age-appropriate recommendations were discussed with her and the rational for the tests reviewed.  When I am informed by the patient that another primary care physician has previously obtained the age-appropriate tests and they are up-to-date, only outstanding tests are ordered and referrals given as necessary.  Abnormal results of tests will be discussed with her when all of her results are completed.  Routine preventative health maintenance measures emphasized: Exercise/Diet/Weight control, Tobacco Warnings, Alcohol/Substance use risks and Stress Management She is scheduled for mammogram in November  Orders No orders of the defined types were placed in this encounter.   No orders of the defined types were placed in this encounter.         F/U  Return in about 1 year (around 10/01/2023) for Annual Physical.  Finis Bud, M.D. 09/30/2022 11:47 AM

## 2022-09-30 NOTE — Progress Notes (Signed)
Patients presents for annual exam today. She is up to date on pap smear.  Patient is due for mammogram, scheduled for 10/21/22. Annual labs declined, will be done with PCP. She states no other questions or concerns at this time.

## 2023-01-24 ENCOUNTER — Ambulatory Visit
Admission: RE | Admit: 2023-01-24 | Discharge: 2023-01-24 | Disposition: A | Discharge status: Home / Self Care | Payer: BC Managed Care – PPO | Source: Ambulatory Visit | Attending: Physician Assistant | Admitting: Physician Assistant

## 2023-01-24 DIAGNOSIS — Z1231 Encounter for screening mammogram for malignant neoplasm of breast: Secondary | ICD-10-CM | POA: Insufficient documentation

## 2023-01-31 ENCOUNTER — Other Ambulatory Visit: Payer: Self-pay | Admitting: Family Medicine

## 2023-01-31 DIAGNOSIS — R928 Other abnormal and inconclusive findings on diagnostic imaging of breast: Secondary | ICD-10-CM

## 2023-01-31 DIAGNOSIS — R921 Mammographic calcification found on diagnostic imaging of breast: Secondary | ICD-10-CM

## 2023-02-01 ENCOUNTER — Ambulatory Visit
Admission: RE | Admit: 2023-02-01 | Discharge: 2023-02-01 | Disposition: A | Discharge status: Home / Self Care | Payer: BC Managed Care – PPO | Source: Ambulatory Visit | Attending: Family Medicine | Admitting: Family Medicine

## 2023-02-01 DIAGNOSIS — R928 Other abnormal and inconclusive findings on diagnostic imaging of breast: Secondary | ICD-10-CM | POA: Diagnosis not present

## 2023-02-01 DIAGNOSIS — R921 Mammographic calcification found on diagnostic imaging of breast: Secondary | ICD-10-CM | POA: Diagnosis present

## 2023-02-03 ENCOUNTER — Other Ambulatory Visit: Payer: Self-pay | Admitting: Family Medicine

## 2023-02-03 DIAGNOSIS — R921 Mammographic calcification found on diagnostic imaging of breast: Secondary | ICD-10-CM

## 2023-02-03 DIAGNOSIS — R928 Other abnormal and inconclusive findings on diagnostic imaging of breast: Secondary | ICD-10-CM

## 2023-02-15 ENCOUNTER — Ambulatory Visit
Admission: RE | Admit: 2023-02-15 | Discharge: 2023-02-15 | Disposition: A | Discharge status: Home / Self Care | Payer: BC Managed Care – PPO | Source: Ambulatory Visit | Attending: Family Medicine | Admitting: Family Medicine

## 2023-02-15 DIAGNOSIS — R928 Other abnormal and inconclusive findings on diagnostic imaging of breast: Secondary | ICD-10-CM

## 2023-02-15 DIAGNOSIS — R921 Mammographic calcification found on diagnostic imaging of breast: Secondary | ICD-10-CM | POA: Diagnosis present

## 2023-02-15 HISTORY — PX: BREAST BIOPSY: SHX20

## 2023-02-15 MED ORDER — LIDOCAINE HCL (PF) 1 % IJ SOLN
15.0000 mL | Freq: Once | INTRAMUSCULAR | Status: AC
  Administered 2023-02-15: 15 mL
  Filled 2023-02-15: qty 15

## 2023-02-15 MED ORDER — LIDOCAINE HCL (PF) 1 % IJ SOLN
15.0000 mL | Freq: Once | INTRAMUSCULAR | Status: DC
  Filled 2023-02-15: qty 15

## 2023-02-15 MED ORDER — LIDOCAINE-EPINEPHRINE 1 %-1:100000 IJ SOLN
10.0000 mL | Freq: Once | INTRAMUSCULAR | Status: DC
  Filled 2023-02-15: qty 10

## 2023-02-15 MED ORDER — LIDOCAINE-EPINEPHRINE 1 %-1:100000 IJ SOLN
10.0000 mL | Freq: Once | INTRAMUSCULAR | Status: AC
  Administered 2023-02-15: 10 mL
  Filled 2023-02-15: qty 10

## 2023-02-16 LAB — SURGICAL PATHOLOGY

## 2023-05-18 ENCOUNTER — Other Ambulatory Visit (HOSPITAL_BASED_OUTPATIENT_CLINIC_OR_DEPARTMENT_OTHER): Payer: Self-pay

## 2023-06-16 IMAGING — DX DG ABDOMEN 1V
1 series · 1 of 1 positions shown · non-contrast
Comparison: Plain film from the previous day.

CLINICAL DATA: 24 hour delay small-bowel follow-through

EXAM:
ABDOMEN - 1 VIEW

[abdomen supine]
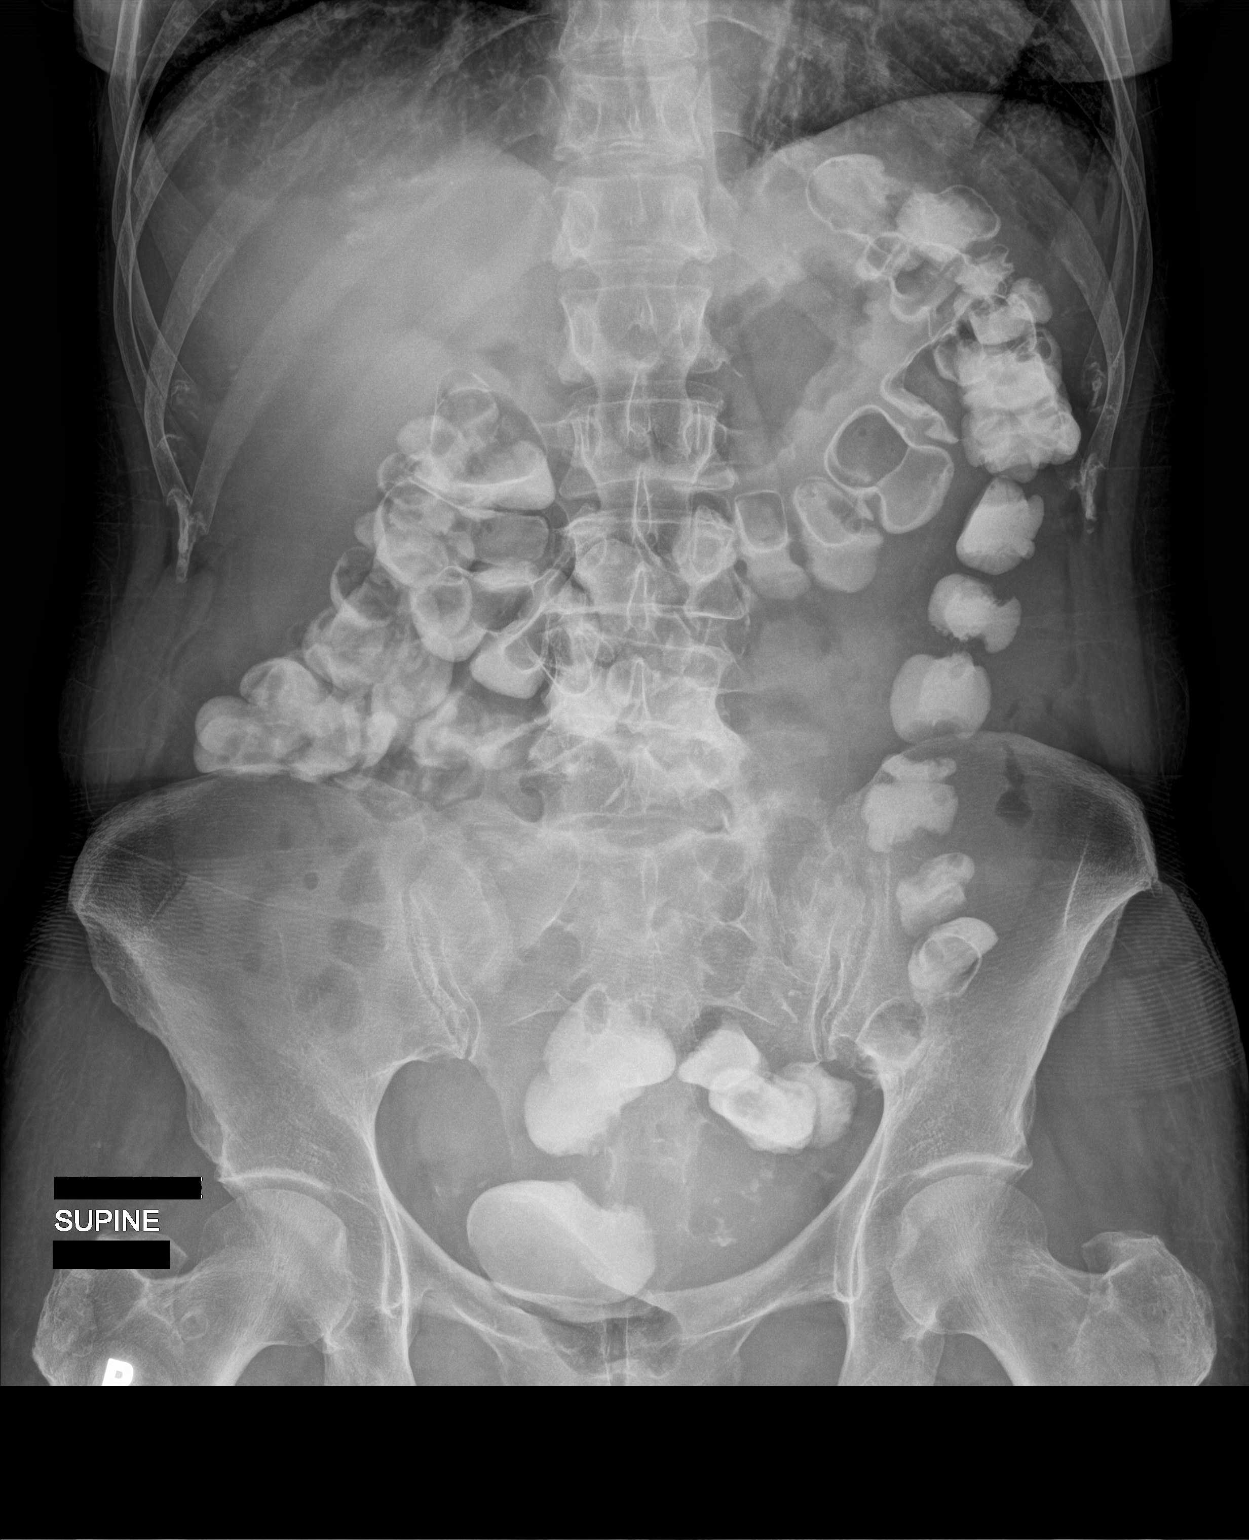

[1 of 1 positions shown; findings below may reference images not displayed]

FINDINGS: Previously administered contrast now lies completely within the
colon consistent with a partial small bowel obstruction. The degree
of small-bowel dilatation has improved in the interval from the
prior exam. No free air is noted. No bony abnormality is noted.
IMPRESSION: Minister contrast now lies within the colon consistent with a
partial small bowel obstruction. The degree of small-bowel
dilatation has improved in the interval from the prior study.
